# Patient Record
Sex: Male | Born: 1975 | Hispanic: Yes | Marital: Single | Smoking: Never smoker
Health system: Southern US, Community
[De-identification: ages and names within clinical notes are randomized; demographics above are authoritative.]

## PROBLEM LIST (undated history)

## (undated) DIAGNOSIS — Z8774 Personal history of (corrected) congenital malformations of heart and circulatory system: Secondary | ICD-10-CM

## (undated) DIAGNOSIS — Q211 Atrial septal defect: Secondary | ICD-10-CM

## (undated) DIAGNOSIS — E1165 Type 2 diabetes mellitus with hyperglycemia: Secondary | ICD-10-CM

## (undated) DIAGNOSIS — I071 Rheumatic tricuspid insufficiency: Secondary | ICD-10-CM

## (undated) DIAGNOSIS — Z9889 Other specified postprocedural states: Secondary | ICD-10-CM

## (undated) DIAGNOSIS — R0602 Shortness of breath: Secondary | ICD-10-CM

## (undated) DIAGNOSIS — E119 Type 2 diabetes mellitus without complications: Secondary | ICD-10-CM

## (undated) DIAGNOSIS — I519 Heart disease, unspecified: Secondary | ICD-10-CM

## (undated) DIAGNOSIS — Q263 Partial anomalous pulmonary venous connection: Secondary | ICD-10-CM

## (undated) DIAGNOSIS — I50812 Chronic right heart failure: Secondary | ICD-10-CM

## (undated) DIAGNOSIS — R0789 Other chest pain: Secondary | ICD-10-CM

## (undated) HISTORY — DX: Personal history of (corrected) congenital malformations of heart and circulatory system: Z87.74

## (undated) HISTORY — DX: Other specified postprocedural states: Z98.890

## (undated) HISTORY — DX: Chronic right heart failure: I50.812

## (undated) HISTORY — DX: Type 2 diabetes mellitus with hyperglycemia: E11.65

## (undated) HISTORY — DX: Partial anomalous pulmonary venous connection: Q26.3

## (undated) HISTORY — DX: Rheumatic tricuspid insufficiency: I07.1

## (undated) HISTORY — DX: Heart disease, unspecified: I51.9

## (undated) HISTORY — DX: Other chest pain: R07.89

## (undated) SURGERY — ECHOCARDIOGRAM, TRANSESOPHAGEAL
Anesthesia: Moderate Sedation

---

## 1997-11-22 DIAGNOSIS — Z8774 Personal history of (corrected) congenital malformations of heart and circulatory system: Secondary | ICD-10-CM

## 1997-11-22 HISTORY — PX: ASD REPAIR: SHX258

## 1997-11-22 HISTORY — DX: Personal history of (corrected) congenital malformations of heart and circulatory system: Z87.74

## 1997-12-14 DIAGNOSIS — Z9889 Other specified postprocedural states: Secondary | ICD-10-CM

## 1997-12-14 HISTORY — DX: Other specified postprocedural states: Z98.890

## 1997-12-14 HISTORY — PX: THORACOTOMY: SUR1349

## 2009-04-27 HISTORY — PX: BACK SURGERY: SHX140

## 2010-04-27 HISTORY — PX: APPENDECTOMY: SHX54

## 2010-12-08 ENCOUNTER — Other Ambulatory Visit: Payer: Self-pay

## 2010-12-08 ENCOUNTER — Emergency Department (HOSPITAL_COMMUNITY)
Admission: EM | Admit: 2010-12-08 | Discharge: 2010-12-09 | Disposition: A | Discharge status: Home / Self Care | Payer: Self-pay | Attending: Emergency Medicine | Admitting: Emergency Medicine

## 2010-12-08 ENCOUNTER — Encounter: Payer: Self-pay | Admitting: Emergency Medicine

## 2010-12-08 DIAGNOSIS — R079 Chest pain, unspecified: Secondary | ICD-10-CM | POA: Insufficient documentation

## 2010-12-08 DIAGNOSIS — E119 Type 2 diabetes mellitus without complications: Secondary | ICD-10-CM | POA: Insufficient documentation

## 2010-12-08 MED ORDER — ASPIRIN 81 MG PO CHEW
CHEWABLE_TABLET | ORAL | Status: AC
  Administered 2010-12-09: 324 mg via ORAL
  Filled 2010-12-08: qty 4

## 2010-12-08 MED ORDER — NITROGLYCERIN 0.4 MG SL SUBL
SUBLINGUAL_TABLET | SUBLINGUAL | Status: AC
Start: 2010-12-08 — End: 2010-12-09
  Administered 2010-12-09: 0.4 mg via SUBLINGUAL
  Filled 2010-12-08: qty 25

## 2010-12-08 NOTE — ED Notes (Signed)
Patient c/o chest pressure when he takes a deep breath.

## 2010-12-09 ENCOUNTER — Emergency Department (HOSPITAL_COMMUNITY): Payer: Self-pay

## 2010-12-09 LAB — COMPREHENSIVE METABOLIC PANEL
Albumin: 3.5 g/dL (ref 3.5–5.2)
BUN: 12 mg/dL (ref 6–23)
Chloride: 93 mEq/L — ABNORMAL LOW (ref 96–112)
Creatinine, Ser: 0.73 mg/dL (ref 0.50–1.35)
GFR calc non Af Amer: >60 mL/min (ref >60)
Total Bilirubin: 0.3 mg/dL (ref 0.3–1.2)

## 2010-12-09 LAB — CARDIAC PANEL(CRET KIN+CKTOT+MB+TROPI)
CK, MB: 1.6 ng/mL (ref 0.3–4.0)
Total CK: 110 U/L (ref 7–232)

## 2010-12-09 LAB — DIFFERENTIAL
Basophils Relative: 0 % (ref 0–1)
Eosinophils Absolute: 0.1 10*3/uL (ref 0.0–0.7)
Eosinophils Relative: 2 % (ref 0–5)
Lymphocytes Relative: 28 % (ref 12–46)
Lymphs Abs: 2.7 10*3/uL (ref 0.7–4.0)
Neutrophils Relative %: 64 % (ref 43–77)

## 2010-12-09 LAB — CBC
MCV: 93.7 fL (ref 78.0–100.0)
Platelets: 180 10*3/uL (ref 150–400)
RBC: 4.42 MIL/uL (ref 4.22–5.81)
RDW: 12.2 % (ref 11.5–15.5)
WBC: 9.6 10*3/uL (ref 4.0–10.5)

## 2010-12-09 MED ORDER — INSULIN ASPART 100 UNIT/ML ~~LOC~~ SOLN
20.0000 [IU] | Freq: Once | SUBCUTANEOUS | Status: AC
  Administered 2010-12-09: 20 [IU] via SUBCUTANEOUS

## 2010-12-09 MED ORDER — METFORMIN HCL 500 MG PO TABS: 500.0000 mg | ORAL_TABLET | Freq: Two times a day (BID) | ORAL | Status: DC

## 2010-12-09 MED ORDER — SODIUM CHLORIDE 0.9 % IV BOLUS (SEPSIS)
1000.0000 mL | Freq: Once | INTRAVENOUS | Status: AC
  Administered 2010-12-09: 1000 mL via INTRAVENOUS

## 2010-12-09 MED ORDER — SODIUM CHLORIDE 0.9 % IV SOLN
Freq: Once | INTRAVENOUS | Status: AC
  Administered 2010-12-09: via INTRAVENOUS

## 2010-12-09 NOTE — ED Provider Notes (Signed)
History     CSN: [id number] Arrival date & time: 12/08/2010 11:10 PM  Chief Complaint  Patient presents with  . Chest Pain   Patient is a 35 y.o. male presenting with chest pain. The history is provided by the patient.  Chest Pain The chest pain began 3 - 5 days ago (The patient states he's been having chest pain off and on for a number of days which is much worse with movement). Chest pain occurs intermittently. The chest pain is unchanged. The pain is associated with lifting. At its most intense, the pain is at 3/10. The pain is currently at 1/10. The quality of the pain is described as sharp. The pain does not radiate. Chest pain is worsened by certain positions. Pertinent negatives for primary symptoms include no fever, no fatigue, no syncope, no shortness of breath, no cough, no palpitations, no abdominal pain, no nausea and no vomiting.  Pertinent negatives for associated symptoms include no claudication. He tried nothing for the symptoms. Risk factors include male gender (diabetes).  Pertinent negatives for past medical history include no aneurysm, no anxiety/panic attacks and no seizures.  Pertinent negatives for family medical history include: no CAD in family.  Procedure history is negative for echocardiogram.     Past Medical History  Diagnosis Date  . Diabetes mellitus     No past surgical history on file.  No family history on file.  History  Substance Use Topics  . Smoking status: Never Smoker   . Smokeless tobacco: Not on file  . Alcohol Use: No      Review of Systems  Constitutional: Negative for fever and fatigue.  HENT: Negative for congestion, sinus pressure and ear discharge.   Eyes: Negative for discharge.  Respiratory: Negative for cough and shortness of breath.   Cardiovascular: Positive for chest pain. Negative for palpitations, claudication and syncope.  Gastrointestinal: Negative for nausea, vomiting, abdominal pain and diarrhea.  Genitourinary:  Negative for frequency and hematuria.  Musculoskeletal: Negative for back pain.  Skin: Negative for rash.  Neurological: Negative for seizures and headaches.  Hematological: Negative.   Psychiatric/Behavioral: Negative for hallucinations.    Physical Exam  BP 117/74  Pulse 71  Temp(Src) 99.3 F (37.4 C) (Oral)  Resp 15  Ht 5\' 5"  (1.651 m)  Wt 180 lb (81.647 kg)  BMI 29.95 kg/m2  SpO2 95%  Physical Exam  Constitutional: He is oriented to person, place, and time. He appears well-developed.  HENT:  Head: Normocephalic and atraumatic.  Eyes: Conjunctivae and EOM are normal. No scleral icterus.  Neck: Neck supple. No thyromegaly present.  Cardiovascular: Normal rate and regular rhythm.  Exam reveals no gallop and no friction rub.   No murmur heard. Pulmonary/Chest: No stridor. He has no wheezes. He has no rales. He exhibits no tenderness.  Abdominal: He exhibits no distension. There is no tenderness. There is no rebound.  Musculoskeletal: Normal range of motion. He exhibits no edema.  Lymphadenopathy:    He has no cervical adenopathy.  Neurological: He is oriented to person, place, and time. Coordination normal.  Skin: No rash noted. No erythema.  Psychiatric: He has a normal mood and affect. His behavior is normal.    ED Course  Procedures.  Results discussed  MDM Non cardiac chest pain Results for orders placed during the hospital encounter of 12/08/10  CBC      Component Value Range   WBC 9.6  4.0 - 10.5 (K/uL)   RBC 4.42  4.22 -  5.81 (MIL/uL)   Hemoglobin 14.5  13.0 - 17.0 (g/dL)   HCT 16.1  09.6 - 04.5 (%)   MCV 93.7  78.0 - 100.0 (fL)   MCH 32.8  26.0 - 34.0 (pg)   MCHC 35.0  30.0 - 36.0 (g/dL)   RDW 40.9  81.1 - 91.4 (%)   Platelets 180  150 - 400 (K/uL)  DIFFERENTIAL      Component Value Range   Neutrophils Relative 64  43 - 77 (%)   Neutro Abs 6.1  1.7 - 7.7 (K/uL)   Lymphocytes Relative 28  12 - 46 (%)   Lymphs Abs 2.7  0.7 - 4.0 (K/uL)   Monocytes  Relative 7  3 - 12 (%)   Monocytes Absolute 0.7  0.1 - 1.0 (K/uL)   Eosinophils Relative 2  0 - 5 (%)   Eosinophils Absolute 0.1  0.0 - 0.7 (K/uL)   Basophils Relative 0  0 - 1 (%)   Basophils Absolute 0.0  0.0 - 0.1 (K/uL)  COMPREHENSIVE METABOLIC PANEL      Component Value Range   Sodium 130 (*) 135 - 145 (mEq/L)   Potassium 3.8  3.5 - 5.1 (mEq/L)   Chloride 93 (*) 96 - 112 (mEq/L)   CO2 19  19 - 32 (mEq/L)   Glucose, Bld 500 (*) 70 - 99 (mg/dL)   BUN 12  6 - 23 (mg/dL)   Creatinine, Ser 7.82  0.50 - 1.35 (mg/dL)   Calcium 9.4  8.4 - 95.6 (mg/dL)   Total Protein 7.8  6.0 - 8.3 (g/dL)   Albumin 3.5  3.5 - 5.2 (g/dL)   AST 35  0 - 37 (U/L)   ALT 54 (*) 0 - 53 (U/L)   Alkaline Phosphatase 151 (*) 39 - 117 (U/L)   Total Bilirubin 0.3  0.3 - 1.2 (mg/dL)   GFR calc non Af Amer >60  >60 (mL/min)   GFR calc Af Amer >60  >60 (mL/min)  CARDIAC PANEL(CRET KIN+CKTOT+MB+TROPI)      Component Value Range   Total CK 110  7 - 232 (U/L)   CK, MB 1.6  0.3 - 4.0 (ng/mL)   Troponin I <0.30  <0.30 (ng/mL)   Relative Index 1.5  0.0 - 2.5   D-DIMER, QUANTITATIVE      Component Value Range   D-Dimer, Quant 0.35  0.00 - 0.48 (ug/mL-FEU)  GLUCOSE, CAPILLARY      Component Value Range   Glucose-Capillary 273 (*) 70 - 99 (mg/dL)   Dg Chest 2 View  06/10/863  *RADIOLOGY REPORT*  Clinical Data: Cough.  Shortness of breath.  Chest pain.  CHEST - 2 VIEW  Comparison: None.  Findings: Low lung volumes are present, causing crowding of the pulmonary vasculature.  Patchy airspace opacities in the perihilar regions and lingula noted, with evidence of prior median sternotomy.  No pleural effusion identified.  IMPRESSION:  1.  Perihilar, lingular, and right middle lobe opacities favor atelectasis or scarring given the low lung volumes, although atypical pneumonia cannot be excluded.  Original Report Authenticated By: Dellia Cloud, M.D.     Date: 12/09/2010  Rate: 91  Rhythm: normal sinus rhythm  QRS  Axis: normal  Intervals: normal  ST/T Wave abnormalities: nonspecific ST changes  Conduction Disutrbances:none  Narrative Interpretation:   Old EKG Reviewed: none available Some inverted t waves anteriorly     Benny Lennert, MD 12/09/10 0451

## 2010-12-09 NOTE — ED Notes (Addendum)
Dr. Estell Harpin notified of glucose level

## 2010-12-09 NOTE — ED Notes (Signed)
Pt states that chest pain did not improve with nitro sl

## 2013-03-27 ENCOUNTER — Encounter (HOSPITAL_COMMUNITY): Payer: Self-pay | Admitting: Emergency Medicine

## 2013-03-27 ENCOUNTER — Emergency Department (HOSPITAL_COMMUNITY): Payer: Self-pay

## 2013-03-27 ENCOUNTER — Observation Stay (HOSPITAL_COMMUNITY)
Admission: EM | Admit: 2013-03-27 | Discharge: 2013-03-29 | Disposition: A | Discharge status: Home Health | Payer: Self-pay | Attending: Family Medicine | Admitting: Family Medicine

## 2013-03-27 DIAGNOSIS — E1165 Type 2 diabetes mellitus with hyperglycemia: Secondary | ICD-10-CM

## 2013-03-27 DIAGNOSIS — R0789 Other chest pain: Principal | ICD-10-CM | POA: Insufficient documentation

## 2013-03-27 DIAGNOSIS — E119 Type 2 diabetes mellitus without complications: Secondary | ICD-10-CM | POA: Insufficient documentation

## 2013-03-27 DIAGNOSIS — Z9889 Other specified postprocedural states: Secondary | ICD-10-CM

## 2013-03-27 DIAGNOSIS — R079 Chest pain, unspecified: Secondary | ICD-10-CM

## 2013-03-27 DIAGNOSIS — R739 Hyperglycemia, unspecified: Secondary | ICD-10-CM

## 2013-03-27 DIAGNOSIS — R0602 Shortness of breath: Secondary | ICD-10-CM | POA: Insufficient documentation

## 2013-03-27 HISTORY — DX: Type 2 diabetes mellitus without complications: E11.9

## 2013-03-27 HISTORY — DX: Other chest pain: R07.89

## 2013-03-27 LAB — CBC WITH DIFFERENTIAL/PLATELET
Basophils Relative: 0 % (ref 0–1)
Eosinophils Absolute: 0.2 10*3/uL (ref 0.0–0.7)
HCT: 48.2 % (ref 39.0–52.0)
Hemoglobin: 16.9 g/dL (ref 13.0–17.0)
MCH: 33.1 pg (ref 26.0–34.0)
MCHC: 35.1 g/dL (ref 30.0–36.0)
MCV: 94.5 fL (ref 78.0–100.0)
Monocytes Absolute: 0.6 10*3/uL (ref 0.1–1.0)
Monocytes Relative: 7 % (ref 3–12)

## 2013-03-27 LAB — COMPREHENSIVE METABOLIC PANEL
Albumin: 4.1 g/dL (ref 3.5–5.2)
BUN: 18 mg/dL (ref 6–23)
Creatinine, Ser: 0.77 mg/dL (ref 0.50–1.35)
GFR calc Af Amer: >90 mL/min (ref >90)
Total Bilirubin: 0.3 mg/dL (ref 0.3–1.2)
Total Protein: 7.7 g/dL (ref 6.0–8.3)

## 2013-03-27 LAB — TROPONIN I: Troponin I: <0.3 ng/mL (ref <0.30)

## 2013-03-27 LAB — D-DIMER, QUANTITATIVE: D-Dimer, Quant: <0.27 ug/mL-FEU (ref 0.00–0.48)

## 2013-03-27 MED ORDER — ONDANSETRON HCL 4 MG/2ML IJ SOLN: 4.0000 mg | Freq: Three times a day (TID) | INTRAMUSCULAR | Status: DC | PRN

## 2013-03-27 NOTE — H&P (Signed)
[facility] History and Physical  Elster Corbello  ZOX:[id number]  DOB: 1975/07/14   DOA: 03/27/2013   PCP:   Delbert Harness  Chief Complaint:  Chest pain since 2:00 today  HPI: [name] is a 37 y.o. male.  Overweight Hispanic gentleman with a history of diabetes comes in complaining of chest pain radiating down his left arm and right-sided flank pain since today. His English is not fluent but he is able to indicate that he sometimes has chest pain that comes and goes but not as severe as today. The pain seems to be aggravated a little bit by breathing and also by bending forward. It is associated with nausea. No dizziness or diaphoresis. He has not tried to take anything to relieve the pain  He was seen 3 different oral medications for his diabetes including metformin but he doesn't know the names of others. He has never seen a cardiologist.  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  Rewiew of Systems:   All systems negative except as marked bold or noted in the HPI;  Constitutional:    malaise, fever and chills. ;  Eyes:   eye pain, redness and discharge. ;  ENMT:   ear pain, hoarseness, nasal congestion, sinus pressure and sore throat. ;  Cardiovascular:    , dyspnea and peripheral edema.  Respiratory:   cough, hemoptysis, wheezing and stridor. ;  Gastrointestinal:  nausea, vomiting, diarrhea, constipation,  melena, blood in stool, hematemesis, jaundice and rectal bleeding. unusual weight loss..   Genitourinary:    frequency, dysuria, incontinence,flank pain and hematuria; Musculoskeletal:   back pain and neck pain.  swelling and trauma.;  Skin: .  pruritus, rash, abrasions, bruising and skin lesion.; ulcerations Neuro:    headache, lightheadedness and neck stiffness.  weakness, altered level of consciousness, altered mental status, extremity weakness, burning feet,  involuntary movement, seizure and syncope.  Psych:    anxiety, depression, insomnia, tearfulness, panic attacks, hallucinations, paranoia, suicidal or homicidal ideation    Past Medical History  Diagnosis Date  . Diabetes mellitus     History reviewed. No pertinent past surgical history.  Medications:  HOME MEDS: Prior to Admission medications   Not on File     Allergies:  No Known Allergies  Social History:   reports that he has never smoked. He does not have any smokeless tobacco history on file. He reports that he does not drink alcohol or use illicit drugs.  Family History: History reviewed. No pertinent family history.   Physical Exam: Filed Vitals:   03/27/13 1955  BP: 128/78  Pulse: 81  Temp: 98.1 F (36.7 C)  TempSrc: Oral  Resp: 18  Height: 5\' 5"  (1.651 m)  Weight: 81.647 kg (180 lb)  SpO2: 95%   Blood pressure 128/78, pulse 81, temperature 98.1 F (36.7 C), temperature source Oral, resp. rate 18, height 5\' 5"  (1.651 m), weight 81.647 kg (180 lb), SpO2 95.00%. Body mass index is 29.95 kg/(m^2).   GEN:  Pleasant Hispanic gentleman lying bed in no acute distress; cooperative with exam PSYCH:  alert and oriented x4; neither anxious nor depressed; affect is appropriate. HEENT: Mucous membranes pink and anicteric; PERRLA; EOM intact; no cervical lymphadenopathy nor thyromegaly or carotid bruit; no JVD; Breasts:: Not examined CHEST WALL: No tenderness; midline sternotomy scar  CHEST: Normal respiration, clear to auscultation bilaterally HEART: Regular rate;  S4 and S3 gallop and 2/ 6 systolic murmur  BACK: No kyphosis no scoliosis; no CVA tenderness ABDOMEN: Obese, soft non-tender; no masses, no organomegaly, normal abdominal bowel sounds; no pannus; no intertriginous candida. Rectal Exam: Not done EXTREMITIES: No bone or joint deformity; ; no edema; no ulcerations. Genitalia: not examined PULSES: 2+ and symmetric SKIN: Normal hydration no rash or  ulceration CNS: Cranial nerves 2-12 grossly intact no focal lateralizing neurologic deficit   Labs on Admission:  Basic Metabolic Panel:  Recent Labs Lab 03/27/13 2154  NA 131*  K 4.2  CL 94*  CO2 24  GLUCOSE 361*  BUN 18  CREATININE 0.77  CALCIUM 9.4   Liver Function Tests:  Recent Labs Lab 03/27/13 2154  AST 28  ALT 34  ALKPHOS 121*  BILITOT 0.3  PROT 7.7  ALBUMIN 4.1   No results found for this basename: LIPASE, AMYLASE,  in the last 168 hours No results found for this basename: AMMONIA,  in the last 168 hours CBC:  Recent Labs Lab 03/27/13 2154  WBC 8.8  NEUTROABS 4.9  HGB 16.9  HCT 48.2  MCV 94.5  PLT 176   Cardiac Enzymes:  Recent Labs Lab 03/27/13 2154  TROPONINI <0.30   BNP: No components found with this basename: POCBNP,  D-dimer: No components found with this basename: D-DIMER,  CBG: No results found for this basename: GLUCAP,  in the last 168 hours  Radiological Exams on Admission: Dg Chest 2 View  03/27/2013   CLINICAL DATA:  Chest pain this evening with history of diabetes  EXAM: CHEST  2 VIEW  COMPARISON:  December 09, 2010  FINDINGS: Mild cardiac enlargement. Status post CABG. Right upper lobe interstitial change consistent with scarring. There also coarse bilateral lower lung zone markings which are stable from prior study.  IMPRESSION: Bilateral parenchymal scarring, with no acute findings.   Electronically Signed   By: Esperanza Heir M.D.   On: 03/27/2013 22:42    EKG: Independently reviewed.  sinus rhythm, left atrial enlargement; right bundle branch block, left ventricular hypertrophy  Assessment/Plan   Active Problems:   Chest pain   Hyperglycemia   Diabetes type 2, uncontrolled   PLAN: Admit patient on a chest pain rule out protocol When necessary nitroglycerin for chest pain Cardiology consult A sliding scale insulin for the time being His primary care physician will take over management in the morning and is aware  of his heart patient diabetic regimen   Other plans as per orders.  Code Status: Full code   Shalece Staffa Nocturnist [facility] Pager [phone]   03/27/2013, 11:43 PM

## 2013-03-27 NOTE — ED Provider Notes (Signed)
CSN: [id number]     Arrival date & time 03/27/13  1948 History  This chart was scribed for Glynn Octave, MD by Bennett Scrape, ED Scribe. This patient was seen in room APA08/APA08 and the patient's care was started at 9:49 PM.   Chief Complaint  Patient presents with  . Flank Pain  . Chest Pain    The history is provided by the patient. A language interpreter was used Hospital doctor ).    HPI Comments: Brittney Caraway is a 37 y.o. male who presents to the Emergency Department complaining of left CP described as pressure that has been constant and radiating into the left arm since it's onset at 2 PM today. He lists SOB and one brief episode of dizziness as associated symptoms. He reports that the pain is mildly aggravated with deep breathing and exertion. He denies having any alleviating modifying factors. He reports prior episodes of the same that resolved after a few hours. He denies any recent cough or fevers, abdominal pain, nausea or emesis. He denies having a h/o cardiac conditions, prior MIs or prior stent placements. He has a h/o DM but denies any known HTN diagnoses.   Past Medical History  Diagnosis Date  . Diabetes mellitus    History reviewed. No pertinent past surgical history. History reviewed. No pertinent family history. History  Substance Use Topics  . Smoking status: Never Smoker   . Smokeless tobacco: Not on file  . Alcohol Use: No    Review of Systems  A complete 10 system review of systems was obtained and all systems are negative except as noted in the HPI and PMH.   Allergies  Review of patient's allergies indicates no known allergies.  Home Medications   No current outpatient prescriptions on file. Triage Vitals: BP 128/78  Pulse 81  Temp(Src) 98.1 F (36.7 C) (Oral)  Resp 18  Ht 5\' 5"  (1.651 m)  Wt 180 lb (81.647 kg)  BMI 29.95 kg/m2  SpO2 95%  Physical Exam  Nursing note and vitals reviewed. Constitutional: He is oriented to  person, place, and time. He appears well-developed and well-nourished. No distress.  HENT:  Head: Normocephalic and atraumatic.  Eyes: Conjunctivae and EOM are normal.  Neck: Normal range of motion. Neck supple. No tracheal deviation present.  Cardiovascular: Normal rate, regular rhythm and normal heart sounds.   No murmur heard. Pulmonary/Chest: Effort normal and breath sounds normal. No respiratory distress. He has no wheezes. He has no rales.  Abdominal: Soft. Bowel sounds are normal. There is no tenderness.  Musculoskeletal: Normal range of motion. He exhibits no edema.  Bilateral lumbar paraspinal tenderness. No midline tenderness   Neurological: He is alert and oriented to person, place, and time. No cranial nerve deficit.  5/5 strength in bilateral lower extremities. Ankle plantar and dorsiflexion intact. Great toe extension intact bilaterally.   Skin: Skin is warm and dry.  Psychiatric: He has a normal mood and affect. His behavior is normal.    ED Course  Procedures (including critical care time)  DIAGNOSTIC STUDIES: Oxygen Saturation is 95% on RA, adequate by my interpretation.    COORDINATION OF CARE: 9:52 PM-Discussed treatment plan which includes CXR, CBC panel, CMP and troponin with pt at bedside and pt agreed to plan.   Labs Review Labs Reviewed  COMPREHENSIVE METABOLIC PANEL - Abnormal; Notable for the following:    Sodium 131 (*)    Chloride 94 (*)    Glucose, Bld 361 (*)    Alkaline  Phosphatase 121 (*)    All other components within normal limits  CBC WITH DIFFERENTIAL  TROPONIN I  D-DIMER, QUANTITATIVE  PRO B NATRIURETIC PEPTIDE   Imaging Review Dg Chest 2 View  03/27/2013   CLINICAL DATA:  Chest pain this evening with history of diabetes  EXAM: CHEST  2 VIEW  COMPARISON:  December 09, 2010  FINDINGS: Mild cardiac enlargement. Status post CABG. Right upper lobe interstitial change consistent with scarring. There also coarse bilateral lower lung zone markings  which are stable from prior study.  IMPRESSION: Bilateral parenchymal scarring, with no acute findings.   Electronically Signed   By: Esperanza Heir M.D.   On: 03/27/2013 22:42    EKG Interpretation    Date/Time:  Monday March 27 2013 19:57:55 EST Ventricular Rate:  88 PR Interval:  150 QRS Duration: 138 QT Interval:  388 QTC Calculation: 469 R Axis:   4 Text Interpretation:  Normal sinus rhythm Possible Left atrial enlargement Right bundle branch block Left ventricular hypertrophy Abnormal ECG When compared with ECG of 08-Dec-2010 23:28, QT has shortened No significant change was found Confirmed by Manus Gunning  MD, Jeri Rawlins ([id number]) on 03/27/2013 10:27:38 PM            MDM   1. Chest pain   2. Hyperglycemia    Patient presents with intermittent chest pain and pressure for the past two days.  Pain radiates to L arm and is associated with nausea.  EKG unchanged. RBBB and LVH.  Troponin negative.  CXR negative.  D-dimer negative.  Heart score is 3. Patient is a diabetic with no previous cardiac workup. His pain description is typical and atypical features. He would benefit from serial enzymes and possible stress test in the morning. He is not have a primary doctor.  I personally performed the services described in this documentation, which was scribed in my presence. The recorded information has been reviewed and is accurate.    Glynn Octave, MD 03/28/13 [id number]

## 2013-03-27 NOTE — ED Notes (Signed)
Pt c/o left chest pressure and bilateral flank pain x 2 days.

## 2013-03-27 NOTE — ED Notes (Signed)
Pt reporting bilateral flank pain and CP x2 days.  No distress noted.  Reported one episode of nausea on Saturday.

## 2013-03-28 ENCOUNTER — Encounter (HOSPITAL_COMMUNITY): Payer: Self-pay | Admitting: *Deleted

## 2013-03-28 DIAGNOSIS — E1165 Type 2 diabetes mellitus with hyperglycemia: Secondary | ICD-10-CM | POA: Diagnosis present

## 2013-03-28 DIAGNOSIS — R0789 Other chest pain: Secondary | ICD-10-CM

## 2013-03-28 DIAGNOSIS — Z9889 Other specified postprocedural states: Secondary | ICD-10-CM

## 2013-03-28 DIAGNOSIS — I369 Nonrheumatic tricuspid valve disorder, unspecified: Secondary | ICD-10-CM

## 2013-03-28 DIAGNOSIS — IMO0002 Reserved for concepts with insufficient information to code with codable children: Secondary | ICD-10-CM

## 2013-03-28 DIAGNOSIS — R739 Hyperglycemia, unspecified: Secondary | ICD-10-CM | POA: Diagnosis present

## 2013-03-28 HISTORY — DX: Reserved for concepts with insufficient information to code with codable children: IMO0002

## 2013-03-28 HISTORY — DX: Type 2 diabetes mellitus with hyperglycemia: E11.65

## 2013-03-28 LAB — GLUCOSE, CAPILLARY
Glucose-Capillary: 187 mg/dL — ABNORMAL HIGH (ref 70–99)
Glucose-Capillary: 273 mg/dL — ABNORMAL HIGH (ref 70–99)
Glucose-Capillary: 212 mg/dL — ABNORMAL HIGH (ref 70–99)

## 2013-03-28 LAB — BASIC METABOLIC PANEL
BUN: 17 mg/dL (ref 6–23)
Calcium: 8.7 mg/dL (ref 8.4–10.5)
Chloride: 101 mEq/L (ref 96–112)
GFR calc non Af Amer: >90 mL/min (ref >90)
Glucose, Bld: 253 mg/dL — ABNORMAL HIGH (ref 70–99)
Potassium: 3.6 mEq/L (ref 3.5–5.1)
Sodium: 135 mEq/L (ref 135–145)

## 2013-03-28 LAB — MAGNESIUM: Magnesium: 2 mg/dL (ref 1.5–2.5)

## 2013-03-28 LAB — URINALYSIS, ROUTINE W REFLEX MICROSCOPIC
Bilirubin Urine: NEGATIVE
Glucose, UA: 500 mg/dL — AB
Hgb urine dipstick: NEGATIVE
Ketones, ur: NEGATIVE mg/dL
Leukocytes, UA: NEGATIVE
Protein, ur: NEGATIVE mg/dL
Specific Gravity, Urine: >1.03 — ABNORMAL HIGH (ref 1.005–1.030)
Urobilinogen, UA: 1 mg/dL (ref 0.0–1.0)
pH: 6 (ref 5.0–8.0)

## 2013-03-28 LAB — LIPID PANEL
LDL Cholesterol: 93 mg/dL (ref 0–99)
Total CHOL/HDL Ratio: 4.7 RATIO
VLDL: 28 mg/dL (ref 0–40)

## 2013-03-28 LAB — CBC
Hemoglobin: 16 g/dL (ref 13.0–17.0)
MCHC: 35 g/dL (ref 30.0–36.0)
Platelets: 165 10*3/uL (ref 150–400)
RBC: 4.84 MIL/uL (ref 4.22–5.81)
WBC: 7.3 10*3/uL (ref 4.0–10.5)

## 2013-03-28 LAB — HEMOGLOBIN A1C: Mean Plasma Glucose: 229 mg/dL — ABNORMAL HIGH (ref <117)

## 2013-03-28 LAB — TROPONIN I: Troponin I: <0.3 ng/mL (ref <0.30)

## 2013-03-28 LAB — TSH: TSH: 2.323 u[IU]/mL (ref 0.350–4.500)

## 2013-03-28 LAB — LIPASE, BLOOD: Lipase: 38 U/L (ref 11–59)

## 2013-03-28 MED ORDER — ASPIRIN 81 MG PO CHEW
324.0000 mg | CHEWABLE_TABLET | Freq: Once | ORAL | Status: AC
  Administered 2013-03-28: 324 mg via ORAL
  Filled 2013-03-28: qty 4

## 2013-03-28 MED ORDER — ONDANSETRON HCL 4 MG PO TABS: 4.0000 mg | ORAL_TABLET | Freq: Four times a day (QID) | ORAL | Status: DC | PRN

## 2013-03-28 MED ORDER — DOCUSATE SODIUM 100 MG PO CAPS
100.0000 mg | ORAL_CAPSULE | Freq: Two times a day (BID) | ORAL | Status: DC
  Administered 2013-03-28 – 2013-03-29 (×4): 100 mg via ORAL
  Filled 2013-03-28 (×4): qty 1

## 2013-03-28 MED ORDER — ONDANSETRON HCL 4 MG/2ML IJ SOLN: 4.0000 mg | INTRAMUSCULAR | Status: DC | PRN

## 2013-03-28 MED ORDER — NITROGLYCERIN 0.4 MG SL SUBL: 0.4000 mg | SUBLINGUAL_TABLET | SUBLINGUAL | Status: DC | PRN

## 2013-03-28 MED ORDER — PANTOPRAZOLE SODIUM 40 MG IV SOLR
40.0000 mg | Freq: Once | INTRAVENOUS | Status: AC
  Administered 2013-03-28: 40 mg via INTRAVENOUS
  Filled 2013-03-28: qty 40

## 2013-03-28 MED ORDER — PANTOPRAZOLE SODIUM 40 MG PO TBEC
40.0000 mg | DELAYED_RELEASE_TABLET | Freq: Two times a day (BID) | ORAL | Status: DC
  Administered 2013-03-28 – 2013-03-29 (×2): 40 mg via ORAL
  Filled 2013-03-28 (×2): qty 1

## 2013-03-28 MED ORDER — SODIUM CHLORIDE 0.9 % IJ SOLN
3.0000 mL | Freq: Two times a day (BID) | INTRAMUSCULAR | Status: DC
  Administered 2013-03-28 (×3): 3 mL via INTRAVENOUS

## 2013-03-28 MED ORDER — TRAZODONE HCL 50 MG PO TABS: 25.0000 mg | ORAL_TABLET | Freq: Every evening | ORAL | Status: DC | PRN

## 2013-03-28 MED ORDER — INSULIN ASPART 100 UNIT/ML ~~LOC~~ SOLN: 0.0000 [IU] | Freq: Every day | SUBCUTANEOUS | Status: DC

## 2013-03-28 MED ORDER — ASPIRIN EC 81 MG PO TBEC
81.0000 mg | DELAYED_RELEASE_TABLET | Freq: Every day | ORAL | Status: DC
  Administered 2013-03-28 – 2013-03-29 (×2): 81 mg via ORAL
  Filled 2013-03-28 (×2): qty 1

## 2013-03-28 MED ORDER — INSULIN ASPART 100 UNIT/ML ~~LOC~~ SOLN
0.0000 [IU] | Freq: Three times a day (TID) | SUBCUTANEOUS | Status: DC
  Administered 2013-03-28: 3 [IU] via SUBCUTANEOUS
  Administered 2013-03-28: 8 [IU] via SUBCUTANEOUS
  Administered 2013-03-28: 5 [IU] via SUBCUTANEOUS
  Administered 2013-03-29: 3 [IU] via SUBCUTANEOUS

## 2013-03-28 MED ORDER — POTASSIUM CHLORIDE IN NACL 20-0.9 MEQ/L-% IV SOLN
INTRAVENOUS | Status: DC
  Administered 2013-03-28: 05:00:00 via INTRAVENOUS

## 2013-03-28 MED ORDER — ACETAMINOPHEN 325 MG PO TABS: 650.0000 mg | ORAL_TABLET | ORAL | Status: DC | PRN

## 2013-03-28 MED ORDER — ENOXAPARIN SODIUM 40 MG/0.4ML ~~LOC~~ SOLN
40.0000 mg | SUBCUTANEOUS | Status: DC
  Administered 2013-03-28: 40 mg via SUBCUTANEOUS
  Filled 2013-03-28: qty 0.4

## 2013-03-28 MED ORDER — PANTOPRAZOLE SODIUM 40 MG IV SOLR
40.0000 mg | Freq: Two times a day (BID) | INTRAVENOUS | Status: DC
  Administered 2013-03-28: 40 mg via INTRAVENOUS
  Filled 2013-03-28: qty 40

## 2013-03-28 MED ORDER — MORPHINE SULFATE 2 MG/ML IJ SOLN: 2.0000 mg | INTRAMUSCULAR | Status: DC | PRN

## 2013-03-28 NOTE — Plan of Care (Signed)
Problem: Phase I Progression Outcomes Goal: OOB as tolerated unless otherwise ordered Patient ambulating in hallway without difficulty.No c/o pain or discomfort noted.

## 2013-03-28 NOTE — Progress Notes (Signed)
[id number] 

## 2013-03-28 NOTE — Progress Notes (Signed)
UR completed 

## 2013-03-28 NOTE — Progress Notes (Signed)
The patient is receiving Protonix by the intravenous route.  Based on criteria approved by the Pharmacy and Therapeutics Committee and the Medical Executive Committee, the medication is being converted to the equivalent oral dose form.  These criteria include: -No Active GI bleeding -Able to tolerate diet of full liquids (or better) or tube feeding OR able to tolerate other medications by the oral or enteral route  If you have any questions about this conversion, please contact the Pharmacy Department (ext [phone]).  Thank you.  Wayland Denis, [facility] 03/28/2013 2:28 PM
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
## 2013-03-28 NOTE — Progress Notes (Signed)
*  PRELIMINARY RESULTS* Echocardiogram 2D Echocardiogram has been performed.  Faustine Tates 03/28/2013, 4:08 PM

## 2013-03-28 NOTE — ED Notes (Signed)
Pt resting quietly.  No distress noted.  NSR on monitor.

## 2013-03-29 DIAGNOSIS — R079 Chest pain, unspecified: Secondary | ICD-10-CM

## 2013-03-29 MED ORDER — METFORMIN HCL 500 MG PO TABS: 500.0000 mg | ORAL_TABLET | Freq: Two times a day (BID) | ORAL | Status: DC

## 2013-03-29 MED ORDER — METFORMIN HCL 500 MG PO TABS
500.0000 mg | ORAL_TABLET | Freq: Two times a day (BID) | ORAL | Status: DC
  Administered 2013-03-29: 500 mg via ORAL
  Filled 2013-03-29: qty 1

## 2013-03-29 MED ORDER — PAROXETINE HCL 20 MG PO TABS
20.0000 mg | ORAL_TABLET | Freq: Every day | ORAL | Status: DC
  Administered 2013-03-29: 20 mg via ORAL
  Filled 2013-03-29: qty 1

## 2013-03-29 MED ORDER — NITROGLYCERIN 0.4 MG SL SUBL: 0.4000 mg | SUBLINGUAL_TABLET | SUBLINGUAL | Status: DC | PRN

## 2013-03-29 MED ORDER — ASPIRIN 81 MG PO TBEC: 81.0000 mg | DELAYED_RELEASE_TABLET | Freq: Every day | ORAL | Status: AC

## 2013-03-29 MED ORDER — PAROXETINE HCL 20 MG PO TABS: 20.0000 mg | ORAL_TABLET | Freq: Every day | ORAL | Status: DC

## 2013-03-29 MED ORDER — LIVING WELL WITH DIABETES BOOK - IN SPANISH
Freq: Once | Status: AC
  Administered 2013-03-29: 13:00:00
  Filled 2013-03-29: qty 1

## 2013-03-29 NOTE — Progress Notes (Signed)
Patient being d/c home with prescriptions. IV cath removed and intact. No pain/swelling at site. Patient verbalizes understanding. Awaiting for transport downstairs.

## 2013-03-29 NOTE — Progress Notes (Signed)
Inpatient Diabetes Program Recommendations  AACE/ADA: New Consensus Statement on Inpatient Glycemic Control (2013)  Target Ranges:  Prepandial:   less than 140 mg/dL      Peak postprandial:   less than 180 mg/dL (1-2 hours)      Critically ill patients:  140 - 180 mg/dL   Results for [name], [name] (MRN [id number]) as of 03/29/2013 09:29  Ref. Range 03/28/2013 03:37  Hemoglobin A1C Latest Range: <5.7 % 9.6 (H)   Results for [name], [name] (MRN [id number]) as of 03/29/2013 09:29  Ref. Range 03/28/2013 07:45 03/28/2013 11:35 03/28/2013 16:20 03/28/2013 21:36 03/29/2013 07:48  Glucose-Capillary Latest Range: 70-99 mg/dL 782 (H) 956 (H) 213 (H) 154 (H) 177 (H)   Inpatient Diabetes Program Recommendations Insulin - Basal: Please consider ordering low dose basal insulin; recommend starting with Levemir 10 units daily.  Note patient has no insurance, may want to use NPH instead of Levemir in which case would recommend starting with NPH 5 units BID.  Note: Blood glucose ranged from 154-273 mg/dl on 08/6 and patient received a total of Novolog 16 units for correction on 12/2.  Fasting glucose this morning was 177 mg/dl.  Please consider ordering low dose basal insulin; recommend starting with Levemir 10 units daily. If basal insulin will be prescribed as an outpatient, may want to order NPH versus Levemir as an inpatient to determine glycemic effect prior to discharge.  If NPH is ordered, recommend starting with NPH 5 units BID.    MD - Please advise NURSING if patient will be discharged on insulin to ensure patient is properly educated prior to discharge.   Thanks, Orlando Penner, RN, MSN, CCRN Diabetes Coordinator Inpatient Diabetes Program [phone] (Team Pager) [phone] ([facility] office) [phone] [facility] office)

## 2013-03-29 NOTE — Discharge Summary (Signed)
[id number] 

## 2013-03-29 NOTE — Progress Notes (Addendum)
Subjective:  Breathing fine. No further chest pain.  Objective:  Vital Signs in the last 24 hours: Temp:  [97.4 F (36.3 C)-98 F (36.7 C)] 97.4 F (36.3 C) (12/03 0500) Pulse Rate:  [58-67] 58 (12/03 0500) Resp:  [15-18] 15 (12/03 0500) BP: (109-131)/(73-82) 109/73 mmHg (12/03 0500) SpO2:  [91 %-96 %] 94 % (12/03 0500) Weight:  [174 lb (78.926 kg)] 174 lb (78.926 kg) (12/03 0500)  Intake/Output from previous day: 12/02 0701 - 12/03 0700 In: 2061.7 [P.O.:720; I.V.:1341.7] Out: 500 [Urine:500] Intake/Output from this shift:    Physical Exam: NECK: Without JVD, HJR, or bruit LUNGS: Clear anterior, posterior, lateral HEART: Regular rate and rhythm, S4, 2-3/6 systolic murmur LSB, no rub, bruit, thrill, or heave EXTREMITIES: Without cyanosis, clubbing, or edema   Lab Results:  Recent Labs  03/27/13 2154 03/28/13 0337  WBC 8.8 7.3  HGB 16.9 16.0  PLT 176 165    Recent Labs  03/27/13 2154 03/28/13 0337  NA 131* 135  K 4.2 3.6  CL 94* 101  CO2 24 24  GLUCOSE 361* 253*  BUN 18 17  CREATININE 0.77 0.75    Recent Labs  03/28/13 0919 03/28/13 1529  TROPONINI <0.30 <0.30   Hepatic Function Panel  Recent Labs  03/27/13 2154  PROT 7.7  ALBUMIN 4.1  AST 28  ALT 34  ALKPHOS 121*  BILITOT 0.3    Recent Labs  03/28/13 0919  CHOL 154   No results found for this basename: PROTIME,  in the last 72 hours  Imaging: Study Conclusions  - Left ventricle: The cavity size was normal. Wall thickness   was increased in a pattern of mild LVH. Systolic function   was normal. The estimated ejection fraction was in the   range of 60% to 65%. Wall motion was normal; there were no   regional wall motion abnormalities. No evidence of VSD.   Features are consistent with a pseudonormal left   ventricular filling pattern, with concomitant abnormal   relaxation and increased filling pressure (grade 2   diastolic dysfunction). - Ventricular septum: The contour showed  diastolic   flattening and systolic flattening consistent with   increased RV pressure and volume. - Mitral valve: Trivial regurgitation. - Left atrium: The atrium was mildly dilated. - Right ventricle: The cavity size was severely dilated. - Right atrium: The atrium was severely dilated. - Atrial septum: No definite evidence of repair. Suggestive   of secundum ASD or PFO. The septum bowed from left to   right, consistent with increased left atrial pressure.   There was a left-to-right shunt. - Tricuspid valve: Dilated annulus with mild prolapse of the   anterior leaflet. Moderate regurgitation. - Pulmonary arteries: PA peak pressure: 42mm Hg (S). - Pericardium, extracardiac: There was no pericardial   effusion. Impressions:  - No prior study available for comparison. Mild LVH with   LVEF 60-65%, grade 2 diastolic dysfunction. Mild left   atrial enlargement. History indicates prior repair of   "hole in heart." There is evidence of RV pressure and   volume overload with severe RV enlargement as well.   Possible secundum ASD or PFO without definitive evidence   of repair. There is a left to right shunt by color   Doppler. Coronary sinus appears dilated. No VSD   visualized. Severe right atrial enlargement. Moderate   tricuspid regurgitation with PASP 42 mmHg. Suggest   obtaining prior operative report to further clarify.    Cardiac Studies:  Assessment/Plan:  1. 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 Cardiac markers negative  2. Possible history of ASD or VSD repair at [facility] 15 years ago, records requested. See 2Decho above. RV pressure and volume overload with severe RV enlargement, possible ASD or PFO without definite evidence of repair.Could add low dose diuretic. Dr. Wyline Mood to review and await records. F/u in our office.  3. Type 2 diabetes mellitus, no regular medical followup, although reportedly on  oral hypoglycemics at home.            LOS: 2 days    Jacolyn Reedy PA-C 03/29/2013, 10:09 AM   Attending Note Patient seen and discussed with NP Lenze. 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 Denies any symptoms today. Do not recommend any further ischemic workup at this time, will reevaluate at follow up. Regarding history of congenital heart disease, we have requested records and are awaiting the reports. His echo shows normal LV function but significant RV and RA enlargement, tricuspid anular dilatation with at least moderate TR, and moderate pulmonary HTN with PASP in mid 40s. Images are limited of the interatrial septum but there appears to be a possible atrial septal defect, most likely a PFO or secundum ASD. It is unclear if this is the hole he referred to previously, and if any form of repair was done. He denies any significant DOE, though notes some mild SOB with very heavy levels of exertion. This warrants further evaluation, first step would be a TEE to better define the septum and shunt. Will make NPO tonight and plan for test tomorrow in endoscopy.   Dina Rich MD

## 2013-03-29 NOTE — Care Management Note (Signed)
    Page 1 of 1   03/29/2013     2:59:14 PM   CARE MANAGEMENT NOTE 03/29/2013  Patient:  [name], [name]   Account Number:  [id number]  Date Initiated:  03/29/2013  Documentation initiated by:  Sharrie Rothman  Subjective/Objective Assessment:   Pt admitted from home with CP. Pt will return home.     Action/Plan:   Pt has no insurance. MATCH voucher given for medication assistance.   Anticipated DC Date:  03/29/2013   Anticipated DC Plan:  HOME/SELF CARE      DC Planning Services  CM consult  MATCH Program      Choice offered to / List presented to:             Status of service:  Completed, signed off Medicare Important Message given?   (If response is "NO", the following Medicare IM given date fields will be blank) Date Medicare IM given:   Date Additional Medicare IM given:    Discharge Disposition:  HOME/SELF CARE  Per UR Regulation:    If discussed at Long Length of Stay Meetings, dates discussed:    Comments:  03/29/13 1500 Arlyss Queen, RN BSN CM

## 2013-03-29 NOTE — Progress Notes (Signed)
NAMEMUJTABA, BOLLIG NO.:  [id number]  MEDICAL RECORD NO.:  [id number]  LOCATION:  A336                          FACILITY:  [facility]  PHYSICIAN:  Melvyn Novas, MDDATE OF BIRTH:  [birth year]  DATE OF PROCEDURE: DATE OF DISCHARGE:                                PROGRESS NOTE   A 37 year old, Latino male with history of either ASD or VSD repair of his heart 15 years ago.  Likewise had a __________ history until 15 years ago.  Reports sharp chest pain occurring over the preceding 2 days, possibly with a pleuritic component.  He also described left-sided pressure pain which lasted less than 30 seconds which did scare him.  He denies any associated dyspnea, orthopnea, PND, palpitations, dizziness, or syncope.  He reports to the hospital.  The patient does have a high degree of anxiety with possible panic disorder.  He is known to me for several months only.  Cardiac markers are negative.  ProBNP is within normal limits.  Chest x-ray shows no evidence of volume overload or ventricular dysfunction.  Blood pressure at present is 99/46, pulse is 56 and regular, temperature is 97.9, respiratory rate is 18.  He does have diabetes which is suboptimally controlled for 7 years and non- insulin dependent.  The patient does not smoke cigarettes.  His lipid status is unknown.  LUNGS:  Clear.  No rales, wheeze, or rhonchi. HEART:  Regular rhythm.  No S3, S4.  No heaves, thrills, or rubs. ABDOMEN:  Soft, nontender.  Bowel sounds normoactive.  IMPRESSION: 1. Atypical chest pain. 2. High degree of anxiety. 3. Non-insulin-dependent diabetes. 4. Unknown lipid status. 5. Status post ASD or VSD repair unknown at [facility].  We will     attempt to get records.  The plan right now is to consider early discharge by tomorrow morning. The patient should ambulate in the hallways to see if any chest pain occurs or recurs, whether it is typical or atypical.  We will do  lipid panel today and make further recommendations as the database expands.     Melvyn Novas, MD     RMD/MEDQ  D:  03/28/2013  T:  03/29/2013  Job:  [id number]

## 2013-03-29 NOTE — Progress Notes (Signed)
Spoke with patient about diabetes and home regimen for diabetes control. Patient reports that he is suppose to be taking 3 different oral medications for diabetes control.  However, he is not able to work right now and is not able to afford his diabetic medications.  Discussed A1C results (9.6% on 03/28/13) and explained what an A1C is, basic pathophysiology of DM Type 2, basic home care, importance of checking CBGs and maintaining good CBG control to prevent long-term and short-term complications.   Patient reports that he has been to the health department in the past but when he last went he was told that they could no longer help him and that he needed to apply for help with the Social Service.  He did go to the [facility] but since he did not have a Tree surgeon card or a green card they were unable to help him.  He also reports that he went to a clinic (not sure of name) in [place] in the past but he was told he could not be seen there because he lived in [place].  Informed patient about the [facility] and [facility] and provided him with contact information in case he needs to go there due to cost of PCP visit and to see if he can receive medication assistance.  Have ordered Living Well With Diabetes educational booklet in Spanish and patient education videos on diabetes.   Will request that Case Manage see patient and see if patient can receive medication assistance vouchers for discharge medications.   Patient verbalized understanding of information discussed and states that he does not have any further questions at this time.   Thanks, Orlando Penner, RN, MSN, CCRN Diabetes Coordinator Inpatient Diabetes Program [phone] (Team Pager) [phone] ([facility] office) [phone] [facility] office)

## 2013-03-30 NOTE — Discharge Summary (Signed)
[name], [name] NO.:  [id number]  MEDICAL RECORD NO.:  [id number]  LOCATION:  A336                          FACILITY:  [facility]  PHYSICIAN:  Melvyn Novas, MDDATE OF BIRTH:  1976-04-16  DATE OF ADMISSION:  03/27/2013 DATE OF DISCHARGE:  12/03/2014LH                              DISCHARGE SUMMARY   The patient is a 37 year old Latino male with a history of either ASD or VSD repair 15 years ago at [facility] with history of anxiety status post ethanolism, has been sober for 15 years with type 2 diabetes with fair control.  He was admitted to the hospital with left-sided atypical chest pain, nonexertional, seen in hospital.  His pain subsided and his cardiac enzymes were negative.  He was hemodynamically stable throughout.  He has fair glycemic control on carb-modified diet.  The patient had a 2D echo, which revealed mild left to right flow in the atrial septum consistent with PFO and mild right-sided volume overload stigmata with otherwise normal chamber size and contractility.  There was no definite evidence of ASD or VSD repair based on echocardiographic criteria.  The patient was ambulated in the hallway, had no exertional component of his chest pain and was felt safe to discharge after being seen by Cardiology who felt the pain was likewise atypical.  He was cautioned to follow up with Cardiology and myself within 1 week's time. His discharge medicines include aspirin 81 mg p.o. daily; nitroglycerin 0.4 mg tablets one sublingual p.r.n. for chest pain.  Told to follow up in the ER if chest pain recurs.  He likewise was discharged on metformin 500 mg p.o. b.i.d. for diabetic control and his lipid status is measured in the hospital and was not back at the time of this discharge.  I will look that up later.     Melvyn Novas, MD     RMD/MEDQ  D:  03/29/2013  T:  03/30/2013  Job:  [id number]

## 2013-04-25 ENCOUNTER — Encounter: Payer: Self-pay | Admitting: Cardiology

## 2013-05-09 ENCOUNTER — Telehealth: Payer: Self-pay | Admitting: *Deleted

## 2013-05-09 DIAGNOSIS — Z01818 Encounter for other preprocedural examination: Secondary | ICD-10-CM

## 2013-05-09 NOTE — Telephone Encounter (Signed)
Message copied by Ovidio KinPULLIAM, Diesha Rostad A on Tue May 09, 2013  5:35 PM ------      Message from: Ashley AkinSTEWART, TERRY G      Created: Tue May 09, 2013  1:34 PM      Regarding: TEE        Please call patient with instructions and date and time of procedure.  Dr.Branch wanted this ordered post hosp visit from 03/29/13.  Have not been able to get in contact with patient.  Letter was sent and he is finally returning call.  You can see note in chart.              Patient is scheduled from Friday 05/26/13 @ 0930.  Patient needs to arrive @ [facility] @ 0830 to register.                  Thanks,      Aurther Lofterry ------

## 2013-05-09 NOTE — Telephone Encounter (Signed)
.  left message to have patient return my call.  

## 2013-05-11 ENCOUNTER — Encounter: Payer: Self-pay | Admitting: *Deleted

## 2013-05-11 NOTE — Telephone Encounter (Signed)
Pt son called to advise that pt does not understand english, unable to locate [facility] interpreter, vm left for Ms. Bradly BienenstockMartinez to contact office, pt son advised about procedure and instructions mailed to pt, pt son then interrupted to pt the procedure instructions/date/time and labs to be drawn, pt son stated the pt did understand his interpretation about TEE procedure, also advised instructions/lab slips are in the mail to verified address in pt chart, this nurse asked pt son if he or the pt had any more questions, the son interpreted to the pt and returned a response of "NO, I understand everything" this nurse informed the pt son to call back to our office with any further questions if needed. Pt son understood.

## 2013-05-11 NOTE — Telephone Encounter (Signed)
ZOX:[id number]:Called number in pt chart and spoke to [facility]aria whom advised this number belongs to her and the pt is her brother n law and was given correct number of [phone], called number provided and left vm of importance to contact our office. This nurse also mailed out a certified letter to the address in the pt chart with instructions, lab slips and apt information to be sent to the pt home and to be signed for as well as instructions to call our office to confirm he received the information

## 2013-05-12 ENCOUNTER — Telehealth: Payer: Self-pay | Admitting: *Deleted

## 2013-05-12 NOTE — Telephone Encounter (Signed)
JYN:WGNFAFYI:Terry Kathaleen BuryMilena Craig Interpreter for [facility] came into the office per vm left from this nurse concerning pt upcoming TEE and ability to understand all instructions/procedure information provided to pt teenage son via telephone and letter sent to pt yesterday, [name] advised pt apt/instructions and procedure information and pt contact information, she advised she will call to ensure the pt understood all instructions and that she will be meet him at Short Stay the day of his TEE to assist with the interpretation, pt son made aware yesterday and interpreter will be contacting the pt. [name] and pt will call our office with any concerns that may arise in the future

## 2013-05-12 NOTE — Telephone Encounter (Signed)
Thank you, please let me know what day he is scheduled for when it becomes available.   Dina RichJonathan Sharolyn Weber MD

## 2013-05-12 NOTE — Telephone Encounter (Signed)
05-26-13 at 9:30am [facility] at 8:30am

## 2013-05-15 NOTE — Telephone Encounter (Signed)
Noted pt household member signed for certified letter with instructions/letter/lab slips with receipt number [id number] [phone]

## 2013-05-22 NOTE — Telephone Encounter (Signed)
Noted/disregaurded

## 2013-05-22 NOTE — Telephone Encounter (Signed)
Please let patient know that we received a notice that medicaid di not approve losartan 100mg  daily for him. Is he ok if we switch him to lisinopril, which is an alternative   Dina RichJonathan Mia Winthrop MD

## 2013-05-22 NOTE — Telephone Encounter (Signed)
The notice about the losartan is entered in error, disregard  Dina RichJonathan Mallorie Norrod MD

## 2013-05-25 ENCOUNTER — Other Ambulatory Visit: Payer: Self-pay | Admitting: Cardiology

## 2013-05-25 DIAGNOSIS — Q211 Atrial septal defect, unspecified: Secondary | ICD-10-CM

## 2013-05-26 ENCOUNTER — Encounter (HOSPITAL_COMMUNITY)
Admission: RE | Disposition: A | Discharge status: Home / Self Care | Payer: Self-pay | Source: Ambulatory Visit | Attending: Cardiology

## 2013-05-26 ENCOUNTER — Ambulatory Visit (HOSPITAL_COMMUNITY): Payer: Self-pay

## 2013-05-26 ENCOUNTER — Encounter: Payer: Self-pay | Admitting: *Deleted

## 2013-05-26 ENCOUNTER — Other Ambulatory Visit: Payer: Self-pay | Admitting: *Deleted

## 2013-05-26 ENCOUNTER — Encounter (HOSPITAL_COMMUNITY): Payer: Self-pay | Admitting: *Deleted

## 2013-05-26 ENCOUNTER — Ambulatory Visit (HOSPITAL_COMMUNITY)
Admission: RE | Admit: 2013-05-26 | Discharge: 2013-05-26 | Disposition: A | Discharge status: Home / Self Care | Payer: Self-pay | Source: Ambulatory Visit | Attending: Cardiology | Admitting: Cardiology

## 2013-05-26 DIAGNOSIS — Q211 Atrial septal defect: Secondary | ICD-10-CM | POA: Insufficient documentation

## 2013-05-26 DIAGNOSIS — Q2111 Secundum atrial septal defect: Secondary | ICD-10-CM | POA: Insufficient documentation

## 2013-05-26 DIAGNOSIS — I071 Rheumatic tricuspid insufficiency: Secondary | ICD-10-CM | POA: Insufficient documentation

## 2013-05-26 DIAGNOSIS — E119 Type 2 diabetes mellitus without complications: Secondary | ICD-10-CM | POA: Insufficient documentation

## 2013-05-26 DIAGNOSIS — Z01812 Encounter for preprocedural laboratory examination: Secondary | ICD-10-CM | POA: Insufficient documentation

## 2013-05-26 DIAGNOSIS — R079 Chest pain, unspecified: Secondary | ICD-10-CM | POA: Insufficient documentation

## 2013-05-26 DIAGNOSIS — I369 Nonrheumatic tricuspid valve disorder, unspecified: Secondary | ICD-10-CM

## 2013-05-26 DIAGNOSIS — Z7982 Long term (current) use of aspirin: Secondary | ICD-10-CM | POA: Insufficient documentation

## 2013-05-26 DIAGNOSIS — Z794 Long term (current) use of insulin: Secondary | ICD-10-CM | POA: Insufficient documentation

## 2013-05-26 HISTORY — DX: Atrial septal defect: Q21.1

## 2013-05-26 HISTORY — PX: TEE WITHOUT CARDIOVERSION: SHX5443

## 2013-05-26 LAB — GLUCOSE, CAPILLARY: Glucose-Capillary: 292 mg/dL — ABNORMAL HIGH (ref 70–99)

## 2013-05-26 SURGERY — ECHOCARDIOGRAM, TRANSESOPHAGEAL
Anesthesia: Moderate Sedation

## 2013-05-26 MED ORDER — LIDOCAINE VISCOUS 2 % MT SOLN
OROMUCOSAL | Status: DC | PRN
  Administered 2013-05-26: 20 mL via OROMUCOSAL

## 2013-05-26 MED ORDER — MIDAZOLAM HCL 5 MG/5ML IJ SOLN
INTRAMUSCULAR | Status: DC | PRN
  Administered 2013-05-26: 0.5 mg via INTRAVENOUS
  Administered 2013-05-26 (×2): 1 mg via INTRAVENOUS

## 2013-05-26 MED ORDER — PAROXETINE HCL 20 MG PO TABS: 20.0000 mg | ORAL_TABLET | Freq: Every day | ORAL | Status: DC

## 2013-05-26 MED ORDER — NITROGLYCERIN 0.4 MG SL SUBL: 0.4000 mg | SUBLINGUAL_TABLET | SUBLINGUAL | Status: DC | PRN

## 2013-05-26 MED ORDER — SODIUM CHLORIDE BACTERIOSTATIC 0.9 % IJ SOLN
INTRAMUSCULAR | Status: AC
  Filled 2013-05-26: qty 10

## 2013-05-26 MED ORDER — MIDAZOLAM HCL 5 MG/5ML IJ SOLN
INTRAMUSCULAR | Status: AC
  Filled 2013-05-26: qty 5

## 2013-05-26 MED ORDER — LIDOCAINE VISCOUS 2 % MT SOLN
OROMUCOSAL | Status: AC
  Filled 2013-05-26: qty 15

## 2013-05-26 MED ORDER — FENTANYL CITRATE 0.05 MG/ML IJ SOLN
INTRAMUSCULAR | Status: AC
  Filled 2013-05-26: qty 2

## 2013-05-26 MED ORDER — METFORMIN HCL 500 MG PO TABS: 500.0000 mg | ORAL_TABLET | Freq: Two times a day (BID) | ORAL | Status: DC

## 2013-05-26 MED ORDER — FENTANYL CITRATE 0.05 MG/ML IJ SOLN
INTRAMUSCULAR | Status: DC | PRN
  Administered 2013-05-26: 25 ug via INTRAVENOUS
  Administered 2013-05-26: 50 ug via INTRAVENOUS

## 2013-05-26 MED ORDER — SODIUM CHLORIDE 0.9 % IV SOLN
INTRAVENOUS | Status: DC
  Administered 2013-05-26: 09:00:00 via INTRAVENOUS

## 2013-05-26 NOTE — Procedures (Signed)
Transesophageal echo  Patient brought to endoscopy suite after appropriate consent was obtained, the patient is spanish speaking, a spanish speaking medical interpreter was present througout the procedure. The oropharynx was anesthesized with viscous lidocaine and cetacaine spray. A total of 2.5 mg of versed and 75 mcg of fentanyl were used throughout the procedure for moderate sedation. The TEE probe was intubated without difficulty, the patients vitals were monitored continously. Follow up official TEE report. 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    Dina RichJonathan Kaelob Persky MD

## 2013-05-26 NOTE — Progress Notes (Signed)
D/C instructions reviewed with pt and wife. Questions answered. Voiced understanding. Instructions also printed in spanish and given to pt and wife. Voiced understanding. Escorted per Rosey Batheresa to Dr Branches office for RX per Dr Branches  request.

## 2013-05-26 NOTE — Progress Notes (Signed)
Spanish interpreter at side. Wife at side. Questions answered. Pt states okay with interpreter leaving. Can communicate well.

## 2013-05-26 NOTE — Progress Notes (Signed)
*  PRELIMINARY RESULTS* Echocardiogram Echocardiogram Transesophageal has been performed.  [name] 05/26/2013, 11:31 AM

## 2013-05-26 NOTE — OR Nursing (Signed)
Terry BuryMilena Craig, [facility] interpreter here to interpret for patient.
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
## 2013-05-26 NOTE — Discharge Instructions (Signed)
Transesophageal Echocardiography Transesophageal echocardiography (TEE) is a picture test of your heart using sound waves. The pictures taken can give very detailed pictures of your heart. This can help your doctor see if there are problems with your heart. TEE can check:  If your heart has blood clots in it.  How well your heart valves are working.  If you have an infection on the inside of your heart.  Some of the major arteries of your heart.  If your heart valve is working after a Psychologist, forensic.  Your heart before a procedure that uses a shock to your heart to get the rhythm back to normal. BEFORE THE PROCEDURE  Do not eat or drink for 6 hours before the procedure or as told by your doctor.  Make plans to have someone drive you home after the procedure. Do not drive yourself home.  An IV tube will be put in your arm. PROCEDURE  You will be given a medicine to help you relax (sedative). It will be given through the IV tube.  A numbing medicine will be sprayed in the back of your throat to help numb it.  The tip of the probe is placed into the back of your mouth. You will be asked to swallow. This helps to pass the probe into your esophagus.  Once the tip of the probe is in the right place, your doctor can take pictures of your heart.  You may feel pressure at the back of your throat. AFTER THE PROCEDURE  You will be taken to a recovery area so the sedative can wear off.  Your throat may be sore and scratchy. This will go away slowly over time.  You will go home when you are fully awake and able to swallow liquids.  You should have someone stay with you for the next 24 hours. Document Released: 02/08/2009 Document Revised: 02/01/2013 Document Reviewed: 10/13/2012 [facility] Patient Information 2014 [place], [place]. Ecocardiograma transesofgico (Transesophageal Echocardiography) El ecocardiograma transesofgico (ETE) es una estudio de imgenes de su corazn que se realiza  mediante ondas de sonido. Las [employer] se obtienen del corazn son Librarian, academic. Esto puede ayudarle al mdico a detectar si hay problemas con su corazn. El ETE puede controlar lo siguiente:  Si hay cogulos de Advertising copywriter.  Si las vlvulas cardacas estn funcionando bien.  Si tiene una infeccin en la parte interna del corazn.  Algunas de las arterias principales del corazn.  Si la vlvula cardaca funciona bien despus de una reparacin.  Su corazn antes de un procedimiento que [place] un choque elctrico para que el ritmo cardaco vuelva a lo normal. ANTES DEL PROCEDIMIENTO  No coma ni beba nada durante las 6 horas previas al procedimiento, o segn le haya indicado el mdico.  Planifique para que alguien lo lleve de vuelta a su casa. No  conduzca usted mismo de regreso a Pensions consultant.  Le insertarn una va intravenosa en el brazo. PROCEDIMIENTO  Le administrarn un medicamento para ayudarlo a relajarse (sedante). Se lo administrarn a travs de la va intravenosa.  Le colocarn anestsicos en la parte posterior de la garganta para adormecerla.  La punta de la sonda se coloca en la parte posterior de la boca. Le pedirn que trague. Esto ayudar a que la sonda pase hacia el esfago.  Una vez que la punta de la sonda est en el lugar correcto, el mdico podr captar imgenes del corazn.  Es probable que sienta una presin en la parte  posterior de la garganta. DESPUS DEL PROCEDIMIENTO  Lo llevarn a un rea de recuperacin hasta que el efecto del sedante haya desaparecido.  Es posible que sienta la garganta dolorida y con picazn. Esto mejorar lentamente con Allied Waste Industriesel tiempo.  Regresar a su casa cuando est totalmente despierto y pueda tragar lquidos.  Deber solicitarle a alguien que se quede con usted durante las siguientes 24 horas. Document Released: 05/16/2010 Document Revised: 02/01/2013 [facility] Patient Information 2014 [place], [place].

## 2013-05-29 ENCOUNTER — Encounter (HOSPITAL_COMMUNITY): Payer: Self-pay | Admitting: Cardiology

## 2013-05-31 ENCOUNTER — Telehealth: Payer: Self-pay | Admitting: Cardiovascular Disease

## 2013-05-31 NOTE — Telephone Encounter (Signed)
[facility] x[facility] Years ago Dr.Cooper is asking to Review These.

## 2013-06-16 ENCOUNTER — Encounter: Payer: Self-pay | Admitting: Cardiovascular Disease

## 2013-06-16 ENCOUNTER — Encounter: Payer: Self-pay | Admitting: *Deleted

## 2013-06-16 ENCOUNTER — Ambulatory Visit (INDEPENDENT_AMBULATORY_CARE_PROVIDER_SITE_OTHER): Payer: Self-pay | Admitting: Cardiovascular Disease

## 2013-06-16 VITALS — BP 120/80 | HR 72 | Ht 65.0 in | Wt 172.0 lb

## 2013-06-16 DIAGNOSIS — Q211 Atrial septal defect, unspecified: Secondary | ICD-10-CM

## 2013-06-16 DIAGNOSIS — Q2111 Secundum atrial septal defect: Secondary | ICD-10-CM

## 2013-06-16 HISTORY — DX: Atrial septal defect, unspecified: Q21.10

## 2013-06-16 HISTORY — DX: Atrial septal defect: Q21.1

## 2013-06-16 LAB — CBC WITH DIFFERENTIAL/PLATELET
BASOS PCT: 0 % (ref 0–1)
Basophils Absolute: 0 10*3/uL (ref 0.0–0.1)
EOS ABS: 0.2 10*3/uL (ref 0.0–0.7)
EOS PCT: 2 % (ref 0–5)
HEMATOCRIT: 46.7 % (ref 39.0–52.0)
Hemoglobin: 16.4 g/dL (ref 13.0–17.0)
Lymphocytes Relative: 26 % (ref 12–46)
Lymphs Abs: 2.3 10*3/uL (ref 0.7–4.0)
MCH: 32.7 pg (ref 26.0–34.0)
MCHC: 35.1 g/dL (ref 30.0–36.0)
MCV: 93 fL (ref 78.0–100.0)
MONO ABS: 0.7 10*3/uL (ref 0.1–1.0)
Monocytes Relative: 8 % (ref 3–12)
NEUTROS ABS: 5.6 10*3/uL (ref 1.7–7.7)
Neutrophils Relative %: 64 % (ref 43–77)
Platelets: 266 10*3/uL (ref 150–400)
RBC: 5.02 MIL/uL (ref 4.22–5.81)
RDW: 12.7 % (ref 11.5–15.5)
WBC: 8.7 10*3/uL (ref 4.0–10.5)

## 2013-06-16 LAB — BASIC METABOLIC PANEL
BUN: 11 mg/dL (ref 6–23)
CHLORIDE: 97 meq/L (ref 96–112)
CO2: 25 meq/L (ref 19–32)
Calcium: 8.7 mg/dL (ref 8.4–10.5)
Creat: 0.6 mg/dL (ref 0.50–1.35)
GLUCOSE: 409 mg/dL — AB (ref 70–99)
Potassium: 4.1 mEq/L (ref 3.5–5.3)
SODIUM: 131 meq/L — AB (ref 135–145)

## 2013-06-16 LAB — PROTIME-INR
INR: 1.22 (ref <1.50)
Prothrombin Time: 15.2 seconds (ref 11.6–15.2)

## 2013-06-16 MED ORDER — CLOPIDOGREL BISULFATE 75 MG PO TABS: 75.0000 mg | ORAL_TABLET | Freq: Every day | ORAL | Status: DC

## 2013-06-16 NOTE — Progress Notes (Signed)
HPI:   38 year old gentleman presenting for evaluation of atrial septal defect. The patient has been told heart murmur since he was a child. He is originally from [place]. When he was in his early 55s he began to notice marked shortness of breath with activity. He was diagnosed with a large secundum ASD and he underwent open surgical repair with a pericardial patch. The operative note describes a 4 cm diameter atrial septal defect at that time. He then did well for many years until the last 6-12 months when he has developed progressive shortness of breath with activity. He was hospitalized in December 2014 with chest pain and shortness of breath. An echocardiogram demonstrated an enlarged right heart with left to right flow across the interatrial septum. A TEE was done and this confirmed a large secundum ASD at the site of previous surgical repair.  The patient is interviewed today with an interpreter. He describes progressive shortness of breath now with minimal activity. He also has frequent palpitations and cough. He describes lightheadedness but has had no episodes of syncope. He denies chest pain or pressure with exertion. He can only walk short distances without significant dyspnea. He denies orthopnea, PND, or leg swelling.  The patient has previously worked in [employer]. He has 5 children, ages ranging from 25 years old to 40 years old. He has not smoked cigarettes or drink alcohol in well over a decade. He was a heavy alcohol drinker in his early 65s. There is no family history of heart disease, either congenital or atherosclerotic coronary disease. Other medical comorbidities include type 2 diabetes.  Outpatient Encounter Prescriptions as of 06/16/2013  Medication Sig  . aspirin EC 81 MG EC tablet Take 1 tablet (81 mg total) by mouth daily.  . metFORMIN (GLUCOPHAGE) 500 MG tablet Take 1 tablet (500 mg total) by mouth 2 (two) times daily with a meal.  . nitroGLYCERIN (NITROSTAT) 0.4 MG SL  tablet Place 1 tablet (0.4 mg total) under the tongue every 5 (five) minutes x 3 doses as needed for chest pain.  Marland Kitchen PARoxetine (PAXIL) 20 MG tablet Take 1 tablet (20 mg total) by mouth daily.    Review of patient's allergies indicates no known allergies.  Past Medical History  Diagnosis Date  . Type 2 diabetes mellitus   . Atrial septal defect     Past Surgical History  Procedure Laterality Date  . Possible asd or vsd repair      [facility] - 15 years ago  . Liver surgery    . Back surgery  2011  . Appendectomy  2012  . Tee without cardioversion N/A 05/26/2013    Procedure: TRANSESOPHAGEAL ECHOCARDIOGRAM (TEE);  Surgeon: Antoine Poche, MD;  Location: [facility];  Service: Cardiology;  Laterality: N/A;    History   Social History  . Marital Status: Single    Spouse Name: N/A    Number of Children: N/A  . Years of Education: N/A   Occupational History  . Not on file.   Social History Main Topics  . Smoking status: Never Smoker   . Smokeless tobacco: Not on file  . Alcohol Use: No     Comment: History of alcohol abuse  . Drug Use: No  . Sexual Activity: Not on file   Other Topics Concern  . Not on file   Social History Narrative  . No narrative on file    Family History  Problem Relation Age of Onset  . Diabetes Mellitus II  ROS:  General: no fevers/chills/night sweats Eyes: no blurry vision, diplopia, or amaurosis ENT: no sore throat or hearing loss Resp: no  wheezing, or hemoptysis, otherwise see history of present illness CV: See history of present illness GI: no abdominal pain, nausea, vomiting, diarrhea, or constipation GU: no dysuria, frequency, or hematuria Skin: no rash Neuro: no headache, numbness, tingling, or weakness of extremities Musculoskeletal: no joint pain or swelling Heme: no bleeding, DVT, or easy bruising Endo: no polydipsia or polyuria  BP 120/80  Pulse 72  Ht 5\' 5"  (1.651 m)  Wt 172 lb (78.019 kg)  BMI 28.62 kg/m2  SpO2  97%  PHYSICAL EXAM: Pt is alert and oriented, WD, WN, in no distress. HEENT: normal Neck: JVP normal. Carotid upstrokes normal without bruits. No thyromegaly. Lungs: equal expansion, clear bilaterally CV: Apex is discrete and nondisplaced, RRR with a grade 2/6 systolic murmur at the left sternal border and a widely split S2  Abd: soft, NT, +BS, no bruit, no hepatosplenomegaly Back: no CVA tenderness Ext: no C/C/E        DP/PT pulses intact and = Skin: warm and dry without rash Neuro: CNII-XII intact             Strength intact = bilaterally  2-D echocardiogram 03/28/2013:  ------------------------------------------------------------ Study Conclusions  - Left ventricle: The cavity size was normal. Wall thickness was increased in a pattern of mild LVH. Systolic function was normal. The estimated ejection fraction was in the range of 60% to 65%. Wall motion was normal; there were no regional wall motion abnormalities. No evidence of VSD. Features are consistent with a pseudonormal left ventricular filling pattern, with concomitant abnormal relaxation and increased filling pressure (grade 2 diastolic dysfunction). - Ventricular septum: The contour showed diastolic flattening and systolic flattening consistent with increased RV pressure and volume. - Mitral valve: Trivial regurgitation. - Left atrium: The atrium was mildly dilated. - Right ventricle: The cavity size was severely dilated. - Right atrium: The atrium was severely dilated. - Atrial septum: No definite evidence of repair. Suggestive of secundum ASD or PFO. The septum bowed from left to right, consistent with increased left atrial pressure. There was a left-to-right shunt. - Tricuspid valve: Dilated annulus with mild prolapse of the anterior leaflet. Moderate regurgitation. - Pulmonary arteries: PA peak pressure: 42mm Hg (S). - Pericardium, extracardiac: There was no pericardial effusion. Impressions:  - No prior  study available for comparison. Mild LVH with LVEF 60-65%, grade 2 diastolic dysfunction. Mild left atrial enlargement. History indicates prior repair of "hole in heart." There is evidence of RV pressure and volume overload with severe RV enlargement as well. Possible secundum ASD or PFO without definitive evidence of repair. There is a left to right shunt by color Doppler. Coronary sinus appears dilated. No VSD visualized. Severe right atrial enlargement. Moderate tricuspid regurgitation with PASP 42 mmHg. Suggest obtaining prior operative report to further clarify.  Transesophageal echocardiogram 05/26/2013: Study Conclusions  - Study data: Technically adequate study. - Left ventricle: The cavity size was normal. Wall thickness was normal. Systolic function was normal. The estimated ejection fraction was in the range of 55% to 60%. Doppler parameters are consistent with abnormal left ventricular relaxation (grade 1 diastolic dysfunction). - Left atrium: No evidence of thrombus in the atrial cavity or appendage. - Right ventricle: The cavity size was severely dilated. Systolic function was moderately reduced. - Right atrium: The atrium was severely dilated. - Atrial septum: There is a secundum ASD with significant left to right  shunt by color Doppler. The width of the defect is approximately 1.6 cm. Technically unable to obtain RVOT and LVOT Doppler images, not able to calculate Qp/Qs. - Tricuspid valve: Moderate-severe regurgitation. The regurgitation appears to be secondary to poor leaflet coaptation due to severe tricuspid anular dilatation.  ASSESSMENT AND PLAN: Secundum ASD. This is a 38 year old gentleman with a large secundum ASD and evidence of RV volume overload on echocardiography. I have reviewed his surface echocardiogram and his TEE images in detail. His ASD measures about 16 mm in diameter by TEE. He appears to have sufficient tissue rims for consideration of  transcatheter closure. He also appears to have a right atrial membrane without evidence of turbulent flow around this. He clearly has significant hemodynamic impact of his atrial septal defect. It appears that part of the surgical patch has become deficient. Will further interrogate the right atrial membrane with intracardiac echo and hemodynamic study at the time of his right heart catheterization. I don't anticipate that this will interfere with device closure of his ASD.  I have reviewed all considerations around transcatheter ASD closure with the patient. I have discussed specific risks, including vascular injury, bleeding, stroke, myocardial infarction, cardiac perforation, cardiac tamponade, emergency cardiac surgery, device embolization, endocarditis, and death. The patient understands these risks are low and occur at a frequency of approximately 1%. He will undergo right heart catheterization and transcatheter ASD closure under guidance of intracardiac echocardiography. The procedure is tentatively scheduled for March 3. He will be started on Plavix 75 mg daily. All of his questions have been answered.  [name] 06/16/2013 6:10 PM

## 2013-06-16 NOTE — Patient Instructions (Addendum)
You are scheduled for an ASD closure with Dr. Excell Seltzerooper on June 27, 2013.  Please see instruction sheet.   Your physician has recommended you make the following change in your medication:  Start Clopidogrel 75 mg by mouth daily

## 2013-06-21 ENCOUNTER — Other Ambulatory Visit: Payer: Self-pay | Admitting: *Deleted

## 2013-06-21 MED ORDER — METFORMIN HCL 1000 MG PO TABS: 1000.0000 mg | ORAL_TABLET | Freq: Two times a day (BID) | ORAL | Status: AC

## 2013-06-23 ENCOUNTER — Encounter (HOSPITAL_COMMUNITY): Payer: Self-pay | Admitting: Pharmacy Technician

## 2013-06-27 ENCOUNTER — Encounter (HOSPITAL_COMMUNITY)
Admission: RE | Disposition: A | Discharge status: Home / Self Care | Payer: Self-pay | Source: Ambulatory Visit | Attending: Cardiovascular Disease

## 2013-06-27 ENCOUNTER — Ambulatory Visit (HOSPITAL_COMMUNITY)
Admission: RE | Admit: 2013-06-27 | Discharge: 2013-06-27 | Disposition: A | Discharge status: Home / Self Care | Payer: Self-pay | Source: Ambulatory Visit | Attending: Cardiovascular Disease | Admitting: Cardiovascular Disease

## 2013-06-27 DIAGNOSIS — Z9889 Other specified postprocedural states: Secondary | ICD-10-CM | POA: Insufficient documentation

## 2013-06-27 DIAGNOSIS — Z7982 Long term (current) use of aspirin: Secondary | ICD-10-CM | POA: Insufficient documentation

## 2013-06-27 DIAGNOSIS — Q2111 Secundum atrial septal defect: Secondary | ICD-10-CM | POA: Insufficient documentation

## 2013-06-27 DIAGNOSIS — Q211 Atrial septal defect: Secondary | ICD-10-CM | POA: Insufficient documentation

## 2013-06-27 DIAGNOSIS — Q219 Congenital malformation of cardiac septum, unspecified: Secondary | ICD-10-CM

## 2013-06-27 DIAGNOSIS — E119 Type 2 diabetes mellitus without complications: Secondary | ICD-10-CM | POA: Insufficient documentation

## 2013-06-27 HISTORY — PX: ASD REPAIR: SHX258

## 2013-06-27 LAB — POCT I-STAT 3, VENOUS BLOOD GAS (G3P V)
ACID-BASE DEFICIT: 1 mmol/L (ref 0.0–2.0)
ACID-BASE DEFICIT: 3 mmol/L — AB (ref 0.0–2.0)
Acid-base deficit: 1 mmol/L (ref 0.0–2.0)
BICARBONATE: 22.9 meq/L (ref 20.0–24.0)
BICARBONATE: 24.4 meq/L — AB (ref 20.0–24.0)
Bicarbonate: 25.1 mEq/L — ABNORMAL HIGH (ref 20.0–24.0)
O2 SAT: 100 %
O2 SAT: 87 %
O2 Saturation: 87 %
PCO2 VEN: 42.8 mmHg — AB (ref 45.0–50.0)
PH VEN: 7.364 — AB (ref 7.250–7.300)
PO2 VEN: 189 mmHg — AB (ref 30.0–45.0)
PO2 VEN: 59 mmHg — AB (ref 30.0–45.0)
TCO2: 24 mmol/L (ref 0–100)
TCO2: 26 mmol/L (ref 0–100)
TCO2: 27 mmol/L (ref 0–100)
pCO2, Ven: 45.1 mmHg (ref 45.0–50.0)
pCO2, Ven: 47.3 mmHg (ref 45.0–50.0)
pH, Ven: 7.314 — ABNORMAL HIGH (ref 7.250–7.300)
pH, Ven: 7.333 — ABNORMAL HIGH (ref 7.250–7.300)
pO2, Ven: 58 mmHg — ABNORMAL HIGH (ref 30.0–45.0)

## 2013-06-27 LAB — POCT ACTIVATED CLOTTING TIME
Activated Clotting Time: 210 seconds
Activated Clotting Time: 171 seconds

## 2013-06-27 LAB — GLUCOSE, CAPILLARY
GLUCOSE-CAPILLARY: 230 mg/dL — AB (ref 70–99)
Glucose-Capillary: 270 mg/dL — ABNORMAL HIGH (ref 70–99)
Glucose-Capillary: 165 mg/dL — ABNORMAL HIGH (ref 70–99)

## 2013-06-27 LAB — POCT I-STAT 3, ART BLOOD GAS (G3+)
Bicarbonate: 26.6 mEq/L — ABNORMAL HIGH (ref 20.0–24.0)
O2 Saturation: 75 %
PCO2 ART: 51.4 mmHg — AB (ref 35.0–45.0)
PH ART: 7.322 — AB (ref 7.350–7.450)
PO2 ART: 44 mmHg — AB (ref 80.0–100.0)
TCO2: 28 mmol/L (ref 0–100)

## 2013-06-27 SURGERY — REPAIR, ATRIAL SEPTAL DEFECT
Anesthesia: Moderate Sedation

## 2013-06-27 MED ORDER — SODIUM CHLORIDE 0.9 % IV SOLN: 1.0000 mL/kg/h | INTRAVENOUS | Status: DC

## 2013-06-27 MED ORDER — ASPIRIN 81 MG PO CHEW
81.0000 mg | CHEWABLE_TABLET | ORAL | Status: AC
  Administered 2013-06-27: 81 mg via ORAL
  Filled 2013-06-27: qty 1

## 2013-06-27 MED ORDER — HEPARIN (PORCINE) IN NACL 2-0.9 UNIT/ML-% IJ SOLN
INTRAMUSCULAR | Status: AC
  Filled 2013-06-27: qty 1000

## 2013-06-27 MED ORDER — SODIUM CHLORIDE 0.9 % IV SOLN
INTRAVENOUS | Status: DC
  Administered 2013-06-27: 08:00:00 via INTRAVENOUS

## 2013-06-27 MED ORDER — SODIUM CHLORIDE 0.9 % IJ SOLN: 3.0000 mL | INTRAMUSCULAR | Status: DC | PRN

## 2013-06-27 MED ORDER — HEPARIN SODIUM (PORCINE) 1000 UNIT/ML IJ SOLN
INTRAMUSCULAR | Status: AC
  Filled 2013-06-27: qty 1

## 2013-06-27 MED ORDER — LIDOCAINE HCL (PF) 1 % IJ SOLN
INTRAMUSCULAR | Status: AC
  Filled 2013-06-27: qty 30

## 2013-06-27 MED ORDER — MIDAZOLAM HCL 2 MG/2ML IJ SOLN
INTRAMUSCULAR | Status: AC
  Filled 2013-06-27: qty 2

## 2013-06-27 MED ORDER — NITROGLYCERIN 0.2 MG/ML ON CALL CATH LAB
INTRAVENOUS | Status: AC
  Filled 2013-06-27: qty 1

## 2013-06-27 MED ORDER — ACETAMINOPHEN 325 MG PO TABS: 650.0000 mg | ORAL_TABLET | ORAL | Status: DC | PRN

## 2013-06-27 MED ORDER — CLOPIDOGREL BISULFATE 75 MG PO TABS: 75.0000 mg | ORAL_TABLET | Freq: Once | ORAL | Status: DC

## 2013-06-27 MED ORDER — SODIUM CHLORIDE 0.9 % IJ SOLN: 3.0000 mL | Freq: Two times a day (BID) | INTRAMUSCULAR | Status: DC

## 2013-06-27 MED ORDER — ONDANSETRON HCL 4 MG/2ML IJ SOLN
4.0000 mg | Freq: Four times a day (QID) | INTRAMUSCULAR | Status: DC | PRN
Start: 2013-06-27 — End: 2013-06-27

## 2013-06-27 MED ORDER — SODIUM CHLORIDE 0.9 % IV SOLN: 250.0000 mL | INTRAVENOUS | Status: DC | PRN

## 2013-06-27 MED ORDER — DIAZEPAM 5 MG PO TABS
5.0000 mg | ORAL_TABLET | ORAL | Status: AC
  Administered 2013-06-27: 5 mg via ORAL
  Filled 2013-06-27: qty 1

## 2013-06-27 MED ORDER — OXYCODONE-ACETAMINOPHEN 5-325 MG PO TABS: 1.0000 | ORAL_TABLET | ORAL | Status: DC | PRN

## 2013-06-27 MED ORDER — FENTANYL CITRATE 0.05 MG/ML IJ SOLN
INTRAMUSCULAR | Status: AC
  Filled 2013-06-27: qty 2

## 2013-06-27 NOTE — Interval H&P Note (Signed)
History and Physical Interval Note:  06/27/2013 9:27 AM  [name]  has presented today for surgery, with the diagnosis of asd  The various methods of treatment have been discussed with the patient and family. After consideration of risks, benefits and other options for treatment, the patient has consented to  Procedure(s): ATRIAL SEPTAL DEFECT (ASD) closure (N/A) as a surgical intervention .  The patient's history has been reviewed, patient examined, no change in status, stable for surgery.  I have reviewed the patient's chart and labs.  Questions were answered to the patient's satisfaction.    Discussed plans again with patient and wife. He has been taking plavix as prescribed. All questions answered and they understand plan for right heart cath, ASD closure under guidance of intracardiac echo. I have reviewed procedural risks extensively at recent office visit.  [name]

## 2013-06-27 NOTE — Progress Notes (Signed)
Have been waiting for interpreter to give discharge instructions.  Given in english and spanish verbally and in writing to pt and wife by this RN and interpreter.  Both verbalize understanding.  Pt has an appointment set up with Dr. Cornelius Moraswen on March 13.  Family verbalizes  Understanding.

## 2013-06-27 NOTE — Discharge Instructions (Signed)
1. Stop taking Plavix  2. We will call you with an appointment time for a cardiac CT scan  3. The cardiac surgery office will call you with an appointment time Angiography, Care After Refer to this sheet in the next few weeks. These instructions provide you with information on caring for yourself after your procedure. Your health care provider may also give you more specific instructions. Your treatment has been planned according to current medical practices, but problems sometimes occur. Call your health care provider if you have any problems or questions after your procedure.  WHAT TO EXPECT AFTER THE PROCEDURE After your procedure, it is typical to have the following sensations:  Minor discomfort or tenderness and a small bump at the catheter insertion site. The bump should usually decrease in size and tenderness within 1 to 2 weeks.  Any bruising will usually fade within 2 to 4 weeks. HOME CARE INSTRUCTIONS   You may need to keep taking blood thinners if they were prescribed for you. Only take over-the-counter or prescription medicines for pain, fever, or discomfort as directed by your health care provider.  Do not apply powder or lotion to the site.  Do not sit in a bathtub, swimming pool, or whirlpool for 5 to 7 days.  You may shower 24 hours after the procedure. Remove the bandage (dressing) and gently wash the site with plain soap and water. Gently pat the site dry.  Inspect the site at least twice daily.  Limit your activity for the first 48 hours. Do not bend, squat, or lift anything over 20 lb (9 kg) or as directed by your health care provider.  Do not drive home if you are discharged the day of the procedure. Have someone else drive you. Follow instructions about when you can drive or return to work. SEEK MEDICAL CARE IF:  You get lightheaded when standing up.  You have drainage (other than a small amount of blood on the dressing).  You have chills.  You have a  fever.  You have redness, warmth, swelling, or pain at the insertion site. SEEK IMMEDIATE MEDICAL CARE IF:   You develop chest pain or shortness of breath, feel faint, or pass out.  You have bleeding, swelling larger than a walnut, or drainage from the catheter insertion site.  You develop pain, discoloration, coldness, or severe bruising in the leg or arm that held the catheter.  You develop bleeding from any other place, such as the bowels. You may see bright red blood in your urine or stools, or your stools may appear black and tarry.  You have heavy bleeding from the site. If this happens, hold pressure on the site. MAKE SURE YOU:  Understand these instructions.  Will watch your condition.  Will get help right away if you are not doing well or get worse. Document Released: 10/30/2004 Document Revised: 12/14/2012 Document Reviewed: 09/05/2012 [facility] Patient Information 2014 [place], [place]. Angiografa - Cuidados posteriores (Angiography, Care After) Siga estas instrucciones durante las prximas semanas. Estas indicaciones le proporcionan informacin general acerca de cmo deber cuidarse despus del procedimiento. El mdico tambin podr darle instrucciones ms especficas. El tratamiento se ha planificado de acuerdo a las prcticas mdicas actuales, pero a veces se producen problemas. Comunquese con el mdico si tiene algn problema o tiene dudas despus del procedimiento.  QU ESPERAR DESPUS DEL PROCEDIMIENTO Despus del procedimiento, es tpico tener las siguientes sensaciones:  Pequeas molestias o sensibilidad y un pequeo bulto en el sitio de insercin del  catter. En 1  2 semanas debe disminuir el tamao y la inflamacin del bulto.  Algn hematoma que generalmente desaparece despus de 2 a 4 semanas. INSTRUCCIONES PARA EL CUIDADO EN EL HOGAR   Posiblemente deba seguir tomando anticoagulantes, si se los recetaron. Slo tome medicamentos de venta libre o recetados para  Primary school teachercalmar el dolor, el malestar o bajar la fiebre, segn las indicaciones de su mdico.  No se aplique polvos ni lociones en el sitio.  No se siente en la baera, en la piscina ni en el jacuzzi por 5 a 7 das.  Podr darse Neomia Dearuna ducha 24 horas despus del procedimiento. Retire el apsito (vendaje )y lave suavemente la zona con agua y jabn comn. Seque con golpecitos suaves.  Inspeccione Immunologistel lugar al Rite Aidmenos dos veces por da.  Limite sus actividades durante las primeras 48 horas. No se incline, no se ponga en cuclillas ni levante objetos que pesen ms de 20 lb (9 kg) o lo que le indique el mdico.  No conduzca el automvil por sus propios medios si le dan el alta el mismo da. Pdale a otra persona que lo lleve. Siga las instrucciones acerca de cuando volver a conducir el automvil o volver a Printmakertrabajar. SOLICITE ATENCIN MDICA SI:  Se siente mareado al pararse.  Tiene un drenaje (que no es una pequea cantidad de sangre en el vendaje).  Tiene escalofros.  Tiene fiebre.  Observa irritacin, calor, hinchazn o dolor en el sitio de la insercin. SOLICITE ATENCIN MDICA DE INMEDIATO SI:   Comienza a sentir dolor en el pecho, le falta el aire, se siente mareado o se desmaya.  Hay sangrado, hinchazn ms grande que una nuez o drenaje en el lugar de la insercin del catter.  Siente dolor, observa un cambio en la coloracin, enfriamiento o un hematoma importante en la pierna o en el brazo donde el catter fue insertado.  Conception Omsbserva sangrado en algn otro lugar, como en los intestinos. Puede ser que observe sangre rojo brillante en la orina o en las heces, o que estas sean de color negro y aspecto alquitranado.  Sangra mucho en el sitio. Si esto ocurre, haga presin Medical laboratory scientific officeren el lugar. ASEGRESE DE QUE:  Comprende estas instrucciones.  Controlar su afeccin.  Recibir ayuda de inmediato si no mejora o si empeora. Document Released: 07/10/2008 Document Revised: 12/14/2012 [facility] Patient  Information 2014 [place], [place].

## 2013-06-27 NOTE — CV Procedure (Signed)
   Cardiac Catheterization Procedure Note  Name: [name] MRN: [id number] DOB: 27-Nov-1975  Procedure: Right Heart Catheterization, Intracardiac echocardiography  Indication: Atrial Septal Defect. This is a 38 year old Hispanic gentleman who underwent surgical [facility] of a large ASD [facility]. He presented with progressive shortness of breath and echocardiography demonstrated an enlarged right heart. The patient underwent a transesophageal echo demonstrating recurrence of his ASD. The defect measured approximately 15 mm in diameter and appeared to be in the midportion of the interatrial septum by TEE. He presents today for planned transcatheter closure of this defect.   Procedural details: The right groin was prepped, draped, and anesthetized with 1% lidocaine. Using modified Seldinger technique, an 8 French sheath was introduced into the right femoral vein and a 9 French sheath was introduced into the right femoral vein just distal to the original sheath. A Swan-Ganz catheter was used to draw oxygen saturations and record right heart pressures. An intracardiac echo probe was advanced into the right atrium and imaging was performed. The patient was given weight-based unfractionated heparin.  Intracardiac echo imaging demonstrated a deficient posterior rim of the ASD, functionally making this a primum ASD. Extensive imaging was performed. The defect was not suitable for transcatheter closure because of this deficient posterior rim. The intracardiac echo probe and Swan-Ganz catheter were removed and the patient was transferred to the recovery room in stable condition. His sheaths will be pulled once his ACT is less than 175 and manual pressure will be used for hemostasis.  Procedural Findings: Hemodynamics:  Hemodynamics: RA 6 RV 37/9 PA 33/11 with a mean of 21 Pulmonary capillary wedge pressure 10  Pulmonary artery saturation 87% Pulmonary  venous saturation 96%   Coronary angiography: Coronary dominance: right  Final Conclusions:   Interatrial septal defect, not suitable for transcatheter closure because of deficient posterior rim  Recommendations: Will obtain a gated cardiac CT to better define the anatomy of the patient's interatrial septal defect and pulmonary veins. Will refer the patient to cardiac surgery.  Contrast: none Complications: no immediate complications  [name] 06/27/2013, 11:56 AM

## 2013-06-27 NOTE — H&P (View-Only) (Signed)
HPI:   38 year old gentleman presenting for evaluation of atrial septal defect. The patient has been told heart murmur since he was a child. He is originally from [place]. When he was in his early 55s he began to notice marked shortness of breath with activity. He was diagnosed with a large secundum ASD and he underwent open surgical repair with a pericardial patch. The operative note describes a 4 cm diameter atrial septal defect at that time. He then did well for many years until the last 6-12 months when he has developed progressive shortness of breath with activity. He was hospitalized in December 2014 with chest pain and shortness of breath. An echocardiogram demonstrated an enlarged right heart with left to right flow across the interatrial septum. A TEE was done and this confirmed a large secundum ASD at the site of previous surgical repair.  The patient is interviewed today with an interpreter. He describes progressive shortness of breath now with minimal activity. He also has frequent palpitations and cough. He describes lightheadedness but has had no episodes of syncope. He denies chest pain or pressure with exertion. He can only walk short distances without significant dyspnea. He denies orthopnea, PND, or leg swelling.  The patient has previously worked in [employer]. He has 5 children, ages ranging from 25 years old to 40 years old. He has not smoked cigarettes or drink alcohol in well over a decade. He was a heavy alcohol drinker in his early 65s. There is no family history of heart disease, either congenital or atherosclerotic coronary disease. Other medical comorbidities include type 2 diabetes.  Outpatient Encounter Prescriptions as of 06/16/2013  Medication Sig  . aspirin EC 81 MG EC tablet Take 1 tablet (81 mg total) by mouth daily.  . metFORMIN (GLUCOPHAGE) 500 MG tablet Take 1 tablet (500 mg total) by mouth 2 (two) times daily with a meal.  . nitroGLYCERIN (NITROSTAT) 0.4 MG SL  tablet Place 1 tablet (0.4 mg total) under the tongue every 5 (five) minutes x 3 doses as needed for chest pain.  Marland Kitchen PARoxetine (PAXIL) 20 MG tablet Take 1 tablet (20 mg total) by mouth daily.    Review of patient's allergies indicates no known allergies.  Past Medical History  Diagnosis Date  . Type 2 diabetes mellitus   . Atrial septal defect     Past Surgical History  Procedure Laterality Date  . Possible asd or vsd repair      [facility] - 15 years ago  . Liver surgery    . Back surgery  2011  . Appendectomy  2012  . Tee without cardioversion N/A 05/26/2013    Procedure: TRANSESOPHAGEAL ECHOCARDIOGRAM (TEE);  Surgeon: Antoine Poche, MD;  Location: [facility];  Service: Cardiology;  Laterality: N/A;    History   Social History  . Marital Status: Single    Spouse Name: N/A    Number of Children: N/A  . Years of Education: N/A   Occupational History  . Not on file.   Social History Main Topics  . Smoking status: Never Smoker   . Smokeless tobacco: Not on file  . Alcohol Use: No     Comment: History of alcohol abuse  . Drug Use: No  . Sexual Activity: Not on file   Other Topics Concern  . Not on file   Social History Narrative  . No narrative on file    Family History  Problem Relation Age of Onset  . Diabetes Mellitus II  ROS:  General: no fevers/chills/night sweats Eyes: no blurry vision, diplopia, or amaurosis ENT: no sore throat or hearing loss Resp: no  wheezing, or hemoptysis, otherwise see history of present illness CV: See history of present illness GI: no abdominal pain, nausea, vomiting, diarrhea, or constipation GU: no dysuria, frequency, or hematuria Skin: no rash Neuro: no headache, numbness, tingling, or weakness of extremities Musculoskeletal: no joint pain or swelling Heme: no bleeding, DVT, or easy bruising Endo: no polydipsia or polyuria  BP 120/80  Pulse 72  Ht 5\' 5"  (1.651 m)  Wt 172 lb (78.019 kg)  BMI 28.62 kg/m2  SpO2  97%  PHYSICAL EXAM: Pt is alert and oriented, WD, WN, in no distress. HEENT: normal Neck: JVP normal. Carotid upstrokes normal without bruits. No thyromegaly. Lungs: equal expansion, clear bilaterally CV: Apex is discrete and nondisplaced, RRR with a grade 2/6 systolic murmur at the left sternal border and a widely split S2  Abd: soft, NT, +BS, no bruit, no hepatosplenomegaly Back: no CVA tenderness Ext: no C/C/E        DP/PT pulses intact and = Skin: warm and dry without rash Neuro: CNII-XII intact             Strength intact = bilaterally  2-D echocardiogram 03/28/2013:  ------------------------------------------------------------ Study Conclusions  - Left ventricle: The cavity size was normal. Wall thickness was increased in a pattern of mild LVH. Systolic function was normal. The estimated ejection fraction was in the range of 60% to 65%. Wall motion was normal; there were no regional wall motion abnormalities. No evidence of VSD. Features are consistent with a pseudonormal left ventricular filling pattern, with concomitant abnormal relaxation and increased filling pressure (grade 2 diastolic dysfunction). - Ventricular septum: The contour showed diastolic flattening and systolic flattening consistent with increased RV pressure and volume. - Mitral valve: Trivial regurgitation. - Left atrium: The atrium was mildly dilated. - Right ventricle: The cavity size was severely dilated. - Right atrium: The atrium was severely dilated. - Atrial septum: No definite evidence of repair. Suggestive of secundum ASD or PFO. The septum bowed from left to right, consistent with increased left atrial pressure. There was a left-to-right shunt. - Tricuspid valve: Dilated annulus with mild prolapse of the anterior leaflet. Moderate regurgitation. - Pulmonary arteries: PA peak pressure: 42mm Hg (S). - Pericardium, extracardiac: There was no pericardial effusion. Impressions:  - No prior  study available for comparison. Mild LVH with LVEF 60-65%, grade 2 diastolic dysfunction. Mild left atrial enlargement. History indicates prior repair of "hole in heart." There is evidence of RV pressure and volume overload with severe RV enlargement as well. Possible secundum ASD or PFO without definitive evidence of repair. There is a left to right shunt by color Doppler. Coronary sinus appears dilated. No VSD visualized. Severe right atrial enlargement. Moderate tricuspid regurgitation with PASP 42 mmHg. Suggest obtaining prior operative report to further clarify.  Transesophageal echocardiogram 05/26/2013: Study Conclusions  - Study data: Technically adequate study. - Left ventricle: The cavity size was normal. Wall thickness was normal. Systolic function was normal. The estimated ejection fraction was in the range of 55% to 60%. Doppler parameters are consistent with abnormal left ventricular relaxation (grade 1 diastolic dysfunction). - Left atrium: No evidence of thrombus in the atrial cavity or appendage. - Right ventricle: The cavity size was severely dilated. Systolic function was moderately reduced. - Right atrium: The atrium was severely dilated. - Atrial septum: There is a secundum ASD with significant left to right  shunt by color Doppler. The width of the defect is approximately 1.6 cm. Technically unable to obtain RVOT and LVOT Doppler images, not able to calculate Qp/Qs. - Tricuspid valve: Moderate-severe regurgitation. The regurgitation appears to be secondary to poor leaflet coaptation due to severe tricuspid anular dilatation.  ASSESSMENT AND PLAN: Secundum ASD. This is a 38 year old gentleman with a large secundum ASD and evidence of RV volume overload on echocardiography. I have reviewed his surface echocardiogram and his TEE images in detail. His ASD measures about 16 mm in diameter by TEE. He appears to have sufficient tissue rims for consideration of  transcatheter closure. He also appears to have a right atrial membrane without evidence of turbulent flow around this. He clearly has significant hemodynamic impact of his atrial septal defect. It appears that part of the surgical patch has become deficient. Will further interrogate the right atrial membrane with intracardiac echo and hemodynamic study at the time of his right heart catheterization. I don't anticipate that this will interfere with device closure of his ASD.  I have reviewed all considerations around transcatheter ASD closure with the patient. I have discussed specific risks, including vascular injury, bleeding, stroke, myocardial infarction, cardiac perforation, cardiac tamponade, emergency cardiac surgery, device embolization, endocarditis, and death. The patient understands these risks are low and occur at a frequency of approximately 1%. He will undergo right heart catheterization and transcatheter ASD closure under guidance of intracardiac echocardiography. The procedure is tentatively scheduled for March 3. He will be started on Plavix 75 mg daily. All of his questions have been answered.  [name] 06/16/2013 6:10 PM

## 2013-06-28 ENCOUNTER — Other Ambulatory Visit: Payer: Self-pay | Admitting: Cardiovascular Disease

## 2013-06-29 ENCOUNTER — Other Ambulatory Visit: Payer: Self-pay | Admitting: Physician Assistant

## 2013-06-29 DIAGNOSIS — Q211 Atrial septal defect, unspecified: Secondary | ICD-10-CM

## 2013-06-30 ENCOUNTER — Ambulatory Visit (HOSPITAL_COMMUNITY)
Admission: RE | Admit: 2013-06-30 | Discharge: 2013-06-30 | Disposition: A | Discharge status: Home / Self Care | Payer: Self-pay | Source: Ambulatory Visit | Attending: Cardiovascular Disease | Admitting: Cardiovascular Disease

## 2013-06-30 DIAGNOSIS — Q2111 Secundum atrial septal defect: Secondary | ICD-10-CM | POA: Insufficient documentation

## 2013-06-30 DIAGNOSIS — Q211 Atrial septal defect: Secondary | ICD-10-CM

## 2013-06-30 DIAGNOSIS — I517 Cardiomegaly: Secondary | ICD-10-CM | POA: Insufficient documentation

## 2013-06-30 DIAGNOSIS — I509 Heart failure, unspecified: Secondary | ICD-10-CM | POA: Insufficient documentation

## 2013-06-30 MED ORDER — IOHEXOL 350 MG/ML SOLN
100.0000 mL | Freq: Once | INTRAVENOUS | Status: AC | PRN
  Administered 2013-06-30: 100 mL via INTRAVENOUS

## 2013-06-30 MED ORDER — METOPROLOL TARTRATE 1 MG/ML IV SOLN
2.5000 mg | Freq: Once | INTRAVENOUS | Status: AC
  Administered 2013-06-30: 2.5 mg via INTRAVENOUS

## 2013-06-30 MED ORDER — METOPROLOL TARTRATE 1 MG/ML IV SOLN
INTRAVENOUS | Status: AC
  Filled 2013-06-30: qty 5

## 2013-06-30 MED ORDER — NITROGLYCERIN 0.4 MG SL SUBL
SUBLINGUAL_TABLET | SUBLINGUAL | Status: AC
  Filled 2013-06-30: qty 1

## 2013-07-07 ENCOUNTER — Encounter: Payer: Self-pay | Admitting: Thoracic Surgery (Cardiothoracic Vascular Surgery)

## 2013-07-07 ENCOUNTER — Institutional Professional Consult (permissible substitution) (INDEPENDENT_AMBULATORY_CARE_PROVIDER_SITE_OTHER): Payer: Self-pay | Admitting: Thoracic Surgery (Cardiothoracic Vascular Surgery)

## 2013-07-07 ENCOUNTER — Other Ambulatory Visit: Payer: Self-pay | Admitting: *Deleted

## 2013-07-07 ENCOUNTER — Other Ambulatory Visit: Payer: Self-pay | Admitting: Thoracic Surgery (Cardiothoracic Vascular Surgery)

## 2013-07-07 VITALS — BP 113/72 | HR 60 | Resp 20 | Ht 65.0 in | Wt 176.0 lb

## 2013-07-07 DIAGNOSIS — I519 Heart disease, unspecified: Secondary | ICD-10-CM | POA: Insufficient documentation

## 2013-07-07 DIAGNOSIS — Q2111 Secundum atrial septal defect: Secondary | ICD-10-CM

## 2013-07-07 DIAGNOSIS — I079 Rheumatic tricuspid valve disease, unspecified: Secondary | ICD-10-CM

## 2013-07-07 DIAGNOSIS — I50812 Chronic right heart failure: Secondary | ICD-10-CM | POA: Insufficient documentation

## 2013-07-07 DIAGNOSIS — Q211 Atrial septal defect, unspecified: Secondary | ICD-10-CM

## 2013-07-07 DIAGNOSIS — I071 Rheumatic tricuspid insufficiency: Secondary | ICD-10-CM

## 2013-07-07 DIAGNOSIS — Q263 Partial anomalous pulmonary venous connection: Secondary | ICD-10-CM | POA: Insufficient documentation

## 2013-07-07 DIAGNOSIS — I7409 Other arterial embolism and thrombosis of abdominal aorta: Secondary | ICD-10-CM

## 2013-07-07 LAB — COMPREHENSIVE METABOLIC PANEL
ALBUMIN: 4.1 g/dL (ref 3.5–5.2)
ALT: 31 U/L (ref 0–53)
AST: 25 U/L (ref 0–37)
Alkaline Phosphatase: 114 U/L (ref 39–117)
BUN: 11 mg/dL (ref 6–23)
CHLORIDE: 97 meq/L (ref 96–112)
CO2: 28 meq/L (ref 19–32)
CREATININE: 0.7 mg/dL (ref 0.50–1.35)
Calcium: 9 mg/dL (ref 8.4–10.5)
Glucose, Bld: 263 mg/dL — ABNORMAL HIGH (ref 70–99)
Potassium: 4.2 mEq/L (ref 3.5–5.3)
Sodium: 133 mEq/L — ABNORMAL LOW (ref 135–145)
Total Bilirubin: 0.8 mg/dL (ref 0.2–1.2)
Total Protein: 7 g/dL (ref 6.0–8.3)

## 2013-07-07 MED ORDER — AMIODARONE HCL 200 MG PO TABS: 200.0000 mg | ORAL_TABLET | Freq: Two times a day (BID) | ORAL | Status: DC

## 2013-07-07 NOTE — H&P (Signed)
[address]       [place] [address]             [phone]     CARDIOTHORACIC SURGERY CONSULTATION REPORT  Referring Provider is [name], MD PCP is [name], MD  Chief Complaint  Patient presents with  . Shortness of Breath    Surgical eval for large recurrent/persistent atrial septal defect, Cardiac Cath 06/27/13, Cardiac CT 06/30/13    HPI:  Patient is a 38 year old Hispanic male with history of congenital heart disease who underwent pericardial patch repair of what was reported to be a secundum type atrial septal defect by Dr. Buford Dresser at [facility] in [place] in 1999. The patient and his wife recall that he was initially discharged from the hospital within 4 or 5 days after surgery, but he was shortly thereafter readmitted with respiratory failure.  He was hospitalized for nearly 3 months during which time he underwent left thoracotomy for lung biopsy. He was told that he had pneumonia and significant liver dysfunction. 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  The patient states that he first began to experience worsening exertional shortness of breath over 6 months ago. He began to fatigue easily and ultimately lost his job 4 months ago because he could not keep pace.  Symptoms continued to progress until the patient was eventually hospitalized at [facility] in December with shortness of breath and atypical chest pain.   An echocardiogram demonstrated an enlarged right heart with left to right flow across the interatrial septum. A TEE was done and this confirmed a large  ASD.  He was referred to Dr. Excell Seltzer took him to the cath lab for possible closure. However, the patient was found to have anatomical characteristics unfavorable for percutaneous closure.  A cardiac gated CT angiogram of the heart was performed to better characterize the anatomy and the patient has been referred for possible surgical  intervention.  The patient is evaluated in the office today with his wife and parents present with the assistance of an interpreter. The patient describes a continued progression of symptoms of exertional shortness of breath over the past 6 months. The patient now get short of breath with minimal physical activity and occasionally at rest. He occasionally wakes up in the middle of the night feeling dyspneic. He denies orthopnea or lower extremity edema. He has not had dizzy spells or syncope. He has mild occasional symptoms of sharp stabbing pains in the left anterior chest that seemed to correspond with episodes of worsening shortness of breath. He has frequent palpitations.  He reports a chronic, dry, nonproductive cough. He denies any history of hemoptysis. He has not had fevers or chills.  He complains that he fatigues very easily.   Past Medical History  Diagnosis Date  . Type 2 diabetes mellitus   . Atrial septal defect   . Atrial septal defect, recurrent 06/16/2013  . Tricuspid regurgitation   . Diabetes type 2, uncontrolled 03/28/2013  . Atypical chest pain 03/27/2013  . Status post thoracotomy 12/14/1997    Left anterior thoracotomy for lung biopsy within 1 month of ASD closure @ [facility]   . Chronic right-sided congestive heart failure   . Right ventricular dysfunction   . S/P atrial septal defect closure 11/22/1997    Pericardial patch closure of reported secundum type ASD via median sternotomy by Dr Buford Dresser @ [facility]     Past Surgical History  Procedure Laterality Date  . Asd repair  11/22/1997    Dr Buford Dresser @ [facility]  . Back surgery  2011  . Appendectomy  2012  . Tee without cardioversion N/A 05/26/2013    Procedure: TRANSESOPHAGEAL ECHOCARDIOGRAM (TEE);  Surgeon: Antoine Poche, MD;  Location: [facility];  Service: Cardiology;  Laterality: N/A;  . Thoracotomy Left 12/14/1997    Lung biopsy @ [facility]    Family History  Problem Relation Age of Onset  . Diabetes Mellitus II       History   Social History  . Marital Status: Single    Spouse Name: N/A    Number of Children: N/A  . Years of Education: N/A   Occupational History  . Not on file.   Social History Main Topics  . Smoking status: Never Smoker   . Smokeless tobacco: Not on file  . Alcohol Use: No     Comment: History of alcohol abuse  . Drug Use: No  . Sexual Activity: Not on file   Other Topics Concern  . Not on file   Social History Narrative  . No narrative on file    Current Outpatient Prescriptions  Medication Sig Dispense Refill  . aspirin EC 81 MG EC tablet Take 1 tablet (81 mg total) by mouth daily.  30 tablet  12  . metFORMIN (GLUCOPHAGE) 1000 MG tablet Take 1 tablet (1,000 mg total) by mouth 2 (two) times daily with a meal.  60 tablet  8  . nitroGLYCERIN (NITROSTAT) 0.4 MG SL tablet Place 1 tablet (0.4 mg total) under the tongue every 5 (five) minutes x 3 doses as needed for chest pain.  25 tablet  4  . amiodarone (PACERONE) 200 MG tablet Take 1 tablet (200 mg total) by mouth 2 (two) times daily. Begin 7 days prior to surgery.  20 tablet  0   No current facility-administered medications for this visit.    No Known Allergies    Review of Systems:   General:  normal appetite, decreased energy, no weight gain, no weight loss, no fever  Cardiac:  no chest pain with exertion, occasional chest pain at rest, + SOB with mild exertion, occasional resting SOB, + PND, no orthopnea, + palpitations, no arrhythmia, no atrial fibrillation, no LE edema, no dizzy spells, no syncope  Respiratory:  + shortness of breath, no home oxygen, no productive cough, + dry cough, no bronchitis, no wheezing, no hemoptysis, no asthma, no pain with inspiration or cough, + possible sleep apnea, no CPAP at night  GI:   no difficulty swallowing, no reflux, no frequent heartburn, no hiatal hernia, no abdominal pain, no constipation, no diarrhea, no hematochezia, no hematemesis, no melena  GU:   no dysuria,   no frequency, no urinary tract infection, no hematuria, no enlarged prostate, no kidney stones, no kidney disease  Vascular:  no pain suggestive of claudication, no pain in feet, no leg cramps, no varicose veins, no DVT, no non-healing foot ulcer  Neuro:   no stroke, no TIA's, no seizures, no headaches, no temporary blindness one eye,  no slurred speech, no peripheral neuropathy, no chronic pain, no instability of gait, no memory/cognitive dysfunction  Musculoskeletal: no arthritis, no joint swelling, no myalgias, no difficulty walking, no mobility   Skin:   no rash, no itching, no skin infections, no pressure sores or ulcerations  Psych:   no anxiety, no depression, no nervousness, + unusual recent stress  Eyes:   no blurry vision, no  floaters, no recent vision changes, no wears glasses or contacts  ENT:   no hearing loss, no loose or painful teeth, no dentures, last saw dentist cannot recall  Hematologic:  no easy bruising, no abnormal bleeding, no clotting disorder, no frequent epistaxis  Endocrine:  + diabetes, + checks CBG's at home     Physical Exam:   BP 113/72  Pulse 60  Resp 20  Ht 5\' 5"  (1.651 m)  Wt 176 lb (79.833 kg)  BMI 29.29 kg/m2  SpO2 97%  General:    well-appearing  HEENT:  Unremarkable   Neck:   no JVD, no bruits, no adenopathy   Chest:   clear to auscultation, symmetrical breath sounds, no wheezes, no rhonchi   CV:   RRR, soft systolic murmur, fixed split second heart sound  Abdomen:  soft, non-tender, no masses   Extremities:  warm, well-perfused, pulses diminished but palpable, no LE edema  Rectal/GU  Deferred  Neuro:   Grossly non-focal and symmetrical throughout  Skin:   Clean and dry, no rashes, no breakdown   Diagnostic Tests:  Transthoracic Echocardiography  Patient:    Jaymie, Misch MR #:       [id number] Study Date: 03/28/2013 Gender:     M Age:        48 Height:     165.1cm Weight:     79.5kg BSA:        1.66m^2 Pt. Status: Room:        A336    Romana Juniper  ADMITTING    Vania Rea  PERFORMING   Delma Freeze Penn  SONOGRAPHER  Metro Kung, RDCS  ATTENDING    Rancour, Jeannett Senior cc:  ------------------------------------------------------------ LV EF: 60% -   65%  ------------------------------------------------------------ Indications:      Chest pain 786.51.  ------------------------------------------------------------ History:   Risk factors:  Diabetes mellitus.  ------------------------------------------------------------ Study Conclusions  - Left ventricle: The cavity size was normal. Wall thickness   was increased in a pattern of mild LVH. Systolic function   was normal. The estimated ejection fraction was in the   range of 60% to 65%. Wall motion was normal; there were no   regional wall motion abnormalities. No evidence of VSD.   Features are consistent with a pseudonormal left   ventricular filling pattern, with concomitant abnormal   relaxation and increased filling pressure (grade 2   diastolic dysfunction). - Ventricular septum: The contour showed diastolic   flattening and systolic flattening consistent with   increased RV pressure and volume. - Mitral valve: Trivial regurgitation. - Left atrium: The atrium was mildly dilated. - Right ventricle: The cavity size was severely dilated. - Right atrium: The atrium was severely dilated. - Atrial septum: No definite evidence of repair. Suggestive   of secundum ASD or PFO. The septum bowed from left to   right, consistent with increased left atrial pressure.   There was a left-to-right shunt. - Tricuspid valve: Dilated annulus with mild prolapse of the   anterior leaflet. Moderate regurgitation. - Pulmonary arteries: PA peak pressure: 42mm Hg (S). - Pericardium, extracardiac: There was no pericardial   effusion. Impressions:  - No prior study available for comparison. Mild LVH with   LVEF  60-65%, grade 2 diastolic dysfunction. Mild left   atrial enlargement. History indicates prior repair of   "hole in heart." There is evidence of RV pressure and   volume overload with severe RV enlargement  as well.   Possible secundum ASD or PFO without definitive evidence   of repair. There is a left to right shunt by color   Doppler. Coronary sinus appears dilated. No VSD   visualized. Severe right atrial enlargement. Moderate   tricuspid regurgitation with PASP 42 mmHg. Suggest   obtaining prior operative report to further clarify.  ------------------------------------------------------------ Labs, prior tests, procedures, and surgery: Possible ASD or VSD repair 15 years ago Transthoracic echocardiography.  M-mode, complete 2D, spectral Doppler, and color Doppler.  Height:  Height: 165.1cm. Height: 65in.  Weight:  Weight: 79.5kg. Weight: 174.9lb.  Body mass index:  BMI: 29.2kg/m^2.  Body surface area:    BSA: 1.20m^2.  Blood pressure:     99/46.  Patient status:  Inpatient.  Location:  Bedside.  ------------------------------------------------------------  ------------------------------------------------------------ Left ventricle:  The cavity size was normal. Wall thickness was increased in a pattern of mild LVH. Systolic function was normal. The estimated ejection fraction was in the range of 60% to 65%. Wall motion was normal; there were no regional wall motion abnormalities. No evidence of VSD. Features are consistent with a pseudonormal left ventricular filling pattern, with concomitant abnormal relaxation and increased filling pressure (grade 2 diastolic dysfunction).   ------------------------------------------------------------ Aortic valve:   Trileaflet. Cusp separation was normal. Doppler:   No significant regurgitation.    VTI ratio of LVOT to aortic valve: 0.57. Peak velocity ratio of LVOT to aortic valve: 0.81.    Mean gradient: 5mm Hg  (S).  ------------------------------------------------------------ Aorta:  Aortic root: The aortic root was normal in size.  ------------------------------------------------------------ Mitral valve:   The valve appears to be grossly normal. Doppler:   Trivial regurgitation.  ------------------------------------------------------------ Left atrium:  The atrium was mildly dilated.  ------------------------------------------------------------ Atrial septum:  No definite evidence of repair. Suggestive of secundum ASD or PFO. The septum bowed from left to right, consistent with increased left atrial pressure. There was a left-to-right shunt.  ------------------------------------------------------------ Right ventricle:  The cavity size was severely dilated. Systolic function was normal.  ------------------------------------------------------------ Ventricular septum:   The contour showed diastolic flattening and systolic flattening consistent with increased RV pressure and volume.  ------------------------------------------------------------ Pulmonic valve:    The valve appears to be grossly normal.  Doppler:   Trivial regurgitation.  ------------------------------------------------------------ Tricuspid valve:  Dilated annulus with mild prolapse of the anterior leaflet.  Doppler:   Moderate regurgitation.  ------------------------------------------------------------ Right atrium:  The atrium was severely dilated.  ------------------------------------------------------------ Pericardium:  There was no pericardial effusion.  ------------------------------------------------------------ Systemic veins: Inferior vena cava: The vessel was dilated; the respirophasic diameter changes were in the normal range (= 50%).  ------------------------------------------------------------  2D measurements        Normal  Doppler measurements   Normal Left ventricle                 Main  pulmonary LVID ED,   44.5 mm     43-52   artery chord,                         Pressure, S    42 mm   =30 PLAX                                             Hg LVID ES,   27.9 mm     23-38   Left ventricle chord,  Ea, lat ann,  8.9 cm/s ------ PLAX                           tiss DP FS, chord,   37 %      >29     E/Ea, lat     7.8      ------ PLAX                           ann, tiss DP    8 LVPW, ED   12.2 mm     ------  Ea, med ann,  7.9 cm/s ------ IVS/LVPW   1.11        <1.3    tiss DP ratio, ED                      E/Ea, med     8.8      ------ Ventricular septum             ann, tiss DP    7 IVS, ED    13.6 mm     ------  LVOT Aorta                          Peak vel, S   79. cm/s ------ Root diam,   32 mm     ------                  8 ED                             VTI, S         14 cm   ------ Left atrium                    Aortic valve AP dim       55 mm     ------  Peak vel, S   99. cm/s ------ AP dim     2.94 cm/m^2 <2.2                    1 index                          Mean vel, S   106 cm/s ------ Right ventricle                VTI, S        24. cm   ------ RVID ED,   48.7 mm     19-38                   5 PLAX                           Mean            5 mm   ------                                gradient, S       Hg                                VTI  ratio     0.5      ------                                LVOT/AV         7                                Peak vel      0.8      ------                                ratio,          1                                LVOT/AV                                Mitral valve                                Peak E vel    70. cm/s ------                                                1                                Peak A vel    23. cm/s ------                                                8                                Deceleration  215 ms   150-23                                time                   0                                 Peak E/A      2.9      ------                                ratio                                Tricuspid valve  Regurg peak   293 cm/s ------                                vel                                Peak RV-RA     34 mm   ------                                gradient, S       Hg                                Max regurg    293 cm/s ------                                vel                                Systemic veins                                Estimated CVP   8 mm   ------                                                  Hg                                Right ventricle                                Pressure, S    42 mm   <30                                                  Hg                                Sa vel, lat   14. cm/s ------                                ann, tiss DP    1   ------------------------------------------------------------ Prepared and Electronically Authenticated by  Nona Dell 2014-12-02T16:50:19.243    Transesophageal Echocardiography  Patient:    Vernard, Gram MR #:       [id number] Study Date: 05/26/2013 Gender:     M Age:        37 Height:     165.1cm Weight:     79.4kg BSA:        1.59m^2 Pt. Status: Room:       APPO  SONOGRAPHER  Metro Kung, RDCS  ADMITTING    Patrick Jupiter, M.D.  ATTENDING    Patrick Jupiter, M.D.  Lisette Abu, M.D.  PERFORMING   [facility] [place] cc:  ------------------------------------------------------------ LV EF: 55% -   60%  ------------------------------------------------------------ Indications:      Atrial Septal Defect 745.5.  ------------------------------------------------------------ History:   PMH:   Chest pain.  Risk factors:  ASD/VSD repair [facility] years ago at [facility] Diabetes mellitus.  ------------------------------------------------------------ Study Conclusions  - Study data: Technically  adequate study. - Left ventricle: The cavity size was normal. Wall thickness   was normal. Systolic function was normal. The estimated   ejection fraction was in the range of 55% to 60%. Doppler   parameters are consistent with abnormal left ventricular   relaxation (grade 1 diastolic dysfunction). - Left atrium: No evidence of thrombus in the atrial cavity   or appendage. - Right ventricle: The cavity size was severely dilated.   Systolic function was moderately reduced. - Right atrium: The atrium was severely dilated. - Atrial septum: There is a secundum ASD with significant   left to right shunt by color Doppler. The width of the   defect is approximately 1.6 cm. Technically unable to   obtain RVOT and LVOT Doppler images, not able to calculate   Qp/Qs. - Tricuspid valve: Moderate-severe regurgitation. The   regurgitation appears to be secondary to poor leaflet   coaptation due to severe tricuspid anular dilatation. Transesophageal echocardiography.  2D and color Doppler. Height:  Height: 165.1cm. Height: 65in.  Weight:  Weight: 79.4kg. Weight: 174.6lb.  Body mass index:  BMI: 29.1kg/m^2.  Body surface area:    BSA: 1.38m^2.  Blood pressure: 111/83.  Patient status:  Outpatient.  Location:  Endoscopy.   ------------------------------------------------------------  ------------------------------------------------------------ Left ventricle:  The cavity size was normal. Wall thickness was normal. Systolic function was normal. The estimated ejection fraction was in the range of 55% to 60%. Doppler parameters are consistent with abnormal left ventricular relaxation (grade 1 diastolic dysfunction).  ------------------------------------------------------------ Aortic valve:   Trileaflet; normal thickness leaflets. There appears to be a Lambls excrescence attached to the left coronary cusp.  Doppler:   There was no stenosis.    No significant  regurgitation.  ------------------------------------------------------------ Aorta:  There is no significant plaque/calcification of the visualized portions of the descending aorta. Aortic root: The aortic root was normal in size.  ------------------------------------------------------------ Mitral valve:   Normal thickness leaflets .  Doppler: There was no evidence for stenosis.    No significant regurgitation.  ------------------------------------------------------------ Left atrium:  The atrium was normal in size.  No evidence of thrombus in the atrial cavity or appendage. The appendage was of normal size. Emptying velocity was normal.  ------------------------------------------------------------ Atrial septum:  There is a secundum ASD with significant left to right shunt by color Doppler. The width of the defect is approximately 1.6 cm. Technically unable to obtain RVOT and LVOT Doppler images, not able to calculate Qp/Qs.   ------------------------------------------------------------ Right ventricle:  The cavity size was severely dilated. Systolic function was moderately reduced.  ------------------------------------------------------------ Pulmonic valve:   Not well visualized.  Doppler:   There was no evidence for stenosis.    No significant regurgitation.   ------------------------------------------------------------ Tricuspid valve:   Doppler:   There was no evidence for stenosis.    Moderate-severe regurgitation. The TR vena contracta is 0.4 cm. The regurgitation appears to be secondary to poor leaflet coaptation due to severe tricuspid anular dilatation.  ------------------------------------------------------------  Right atrium:  The atrium was severely dilated.   ------------------------------------------------------------ Post procedure conclusions Ascending Aorta:  - There is no significant plaque/calcification of the   visualized portions of the descending  aorta.  ------------------------------------------------------------  2D measurements     Normal     Doppler measurements   Normal LVOT                           Mitral valve Diam, S     21 mm   ------     Peak E vel    48. cm/s ------ Area      3.46 cm^2 ------                     4 Aorta                          Peak A vel    85. cm/s ------ Root diam   30 mm   ------                     1                                Deceleration  180 ms   150-23                                time                   0                                Peak E/A      0.6      ------                                ratio   ------------------------------------------------------------ Prepared and Electronically Authenticated by  Patrick Jupiter, M.D. 2015-01-30T17:37:04.720    Cardiac Catheterization Procedure Note  Name: Camarion Weier MRN: [id number] DOB: [birth year]  Procedure: Right Heart Catheterization, Intracardiac echocardiography  Indication: Atrial Septal Defect. This is a 38 year old Hispanic gentleman who underwent surgical repair of a large ASD approximately 20 years ago at [facility]. He presented with progressive shortness of breath and echocardiography demonstrated an enlarged right heart. The patient underwent a transesophageal echo demonstrating recurrence of his ASD. The defect measured approximately 15 mm in diameter and appeared to be in the midportion of the interatrial septum by TEE. He presents today for planned transcatheter closure of this defect.         Procedural details: The right groin was prepped, draped, and anesthetized with 1% lidocaine. Using modified Seldinger technique, an 8 French sheath was introduced into the right femoral vein and a 9 French sheath was introduced into the right femoral vein just distal to the original sheath. A Swan-Ganz catheter was used to draw oxygen saturations and record right heart pressures. An intracardiac echo probe was  advanced into the right atrium and imaging was performed. The patient was given weight-based unfractionated heparin.  Intracardiac echo imaging demonstrated a deficient posterior rim of the ASD, functionally making this a primum ASD. Extensive imaging was performed. The defect  was not suitable for transcatheter closure because of this deficient posterior rim. The intracardiac echo probe and Swan-Ganz catheter were removed and the patient was transferred to the recovery room in stable condition. His sheaths will be pulled once his ACT is less than 175 and manual pressure will be used for hemostasis.  Procedural Findings: Hemodynamics:  Hemodynamics: RA 6 RV 37/9 PA 33/11 with a mean of 21 Pulmonary capillary wedge pressure 10  Pulmonary artery saturation 87% Pulmonary venous saturation 96%              Coronary angiography: Coronary dominance: right  Final Conclusions:   Interatrial septal defect, not suitable for transcatheter closure because of deficient posterior rim  Recommendations: Will obtain a gated cardiac CT to better define the anatomy of the patient's interatrial septal defect and pulmonary veins. Will refer the patient to cardiac surgery.  Contrast: none Complications: no immediate complications  [name] 06/27/2013, 11:56 AM    Cardiac/Coronary  CT   TECHNIQUE:   The patient was scanned on a Philips 256 scanner.   FINDINGS:   A 120 kV prospective scan was triggered in the ascending thoracic   aorta at 111 HU's. Axial non-contrast 3mm slices were carried out   through the heart. The data set was analyzed on a dedicated work   station and scored using the Agatson method. Gantry rotation speed   was 270 msecs and collimation was .9 mm. 2.5 mg of iv Metoprolol and   0.4 mg of sl NTG was given. The 3D data set was reconstructed in 5%   intervals of the 67-82 % of the R-R cycle. Diastolic phases were   analyzed on a dedicated work station using MPR,  MIP and VRT modes.   The patient received 100 cc of contrast. Heart rate at the time of acquisition was 56/minute.   1. Coronary Arteries:   Coronary arteries originate in the normal position. There is left dominance.   Left main gives rise to LAD and left circumflex artery.   Left main is a large caliber vessel with no plague.   LAD is a large caliber vessel that has no CAD. It gives rise to 1 diagonal branch without any plague.   LCX artery is a large dominant vessel that gives rise to an obtuse marginal branch, PDA and a very small PLVB. There is no evidence of plague.   RCA is a small non-dominant vessel that supply few acute marginal branches to the severely enlarged and hypertrophied right ventricle. There is no plague.   2. There is a large inferior sinus venosus type ASD located in the inferior portion of the interatrial septum with direct continuation into left lateral wall of the most superior portion of IVC (IVC overring interatrial septum). As a consequence there is very thin and short inferior rim.   The diameters are as follows: Antero-posterior 22 mm, superio-inferior 21 mm.   Rims measurements:   Anterior:  14 mm   Posterior: 12 mm, minimum 6 mm at the most inferior portion   Superior: 19 mm   Inferior:  5 mm   There is possible partial anomalous pulmonary venous return with RUPV (right upper pulmonary vein) draining into the SVC. The remaining 4 pulmonary veins (RMPV, PLPV, LUPV and LLPV) drain in a normal position into the left atrium.   3. Dilated main pulmonary artery measuring 43 x 36 mm. Dilated right pulmonary artery measuring 28 x 28 mm, left pulmonary artery measuring 31 x 30  mm. Increased pulmonary vasculature consistent with pulmonary hypertension.   4. Severe dilatation of the right ventricle with mild RVH and hypertrabeculation. Severely dilated right atrium.   Dilated IVC measuring 39 x 28 mm.   Intact coronary sinus.   3.   Mild left ventricular hypertrophy, mildly dilated left atrium.   IMPRESSION:   1. Coronary calcium score of 0. This was 0 percentile for age and sex matched control. Normal origin of coronary arteries, no evidence of CAD, left dominance.   2. 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 Dilated IVC overriding interatrial septum. 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   3. Dilated pulmonary arteries.   4. Severe right ventricular dilatation with right ventricular hypertrophy. Severely dilated right atrium.   5. Mild left ventricular hypertrophy, mildly dilated left atrium.   Tobias Alexander     Electronically Signed   By: Tobias Alexander   On: 07/02/2013 11:47       Study Result    EXAM: OVER-READ INTERPRETATION  CT CHEST   The following report is an over-read performed by radiologist Dr. Royal [facility] on 06/30/2013. This over-read does not include interpretation of cardiac or coronary anatomy or pathology. The interpretation by the cardiologist is attached.   COMPARISON:  No priors.   FINDINGS: Architectural distortion and in the anterior aspects of the upper lobes of the lungs bilaterally, and the right middle lobe, most compatible with chronic scarring (likely related to prior surgery). This is rather mild. Within the visualized portions of the thorax there is no acute consolidative airspace disease, no suspicious appearing pulmonary nodule or mass, and no pleural effusion. Several small pleural calcifications in the anterior aspect of the left hemithorax are noted. Visualized portions of the upper abdomen are remarkable for calcifications in the spleen, presumably granulomas. No aggressive appearing lytic or blastic lesions are noted within the visualized portions of the skeleton. Sternotomy wires.    IMPRESSION: 1. Mild postoperative scarring in the paramediastinal aspect of the lungs bilaterally. Otherwise, no significant noncardiac findings noted.   Electronically Signed: By: Trudie Reed M.D. 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  I suspect the patient originally had a sinus venosus-type atrial septal defect that was missed at the time of his original surgery.  Interestingly, the patient suffered from respiratory failure after his initial operation 15 years ago.  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  There is moderate to severe right ventricular chamber enlargement with moderate to severe tricuspid regurgitation.  Pulmonary artery pressures are only very mildly elevated and the shunt across the atrial septal defect remains entirely left to right.   Plan:  With the assistance of an interpreter the situation was explained at length with the patient and his entire family in the office today. The need for redo median sternotomy for redo closure of the atrial septal defect with possible repair of partial anomalous pulmonary venous return and tricuspid valve repair were discussed.  Risks associated with surgical intervention have been discussed at length.  Long-term prognosis with  continued medical therapy without surgery was discussed at length. The possibility of referral to a tertiary care center where adult congenital heart surgery is performed more commonly has also been offered as an alternative.  We plan to proceed with surgery on Thursday, 07/20/2013.  The patient and his family understand and accept all  potential risks of surgery including but not limited to risk of death, stroke or other neurologic complication, myocardial infarction, acute and/or chronic heart failure potentially requiring mechanical circulatory support, respiratory failure, renal failure, liver failure, bleeding requiring transfusion and/or reexploration, arrhythmia, heart block or bradycardia potentially requiring permanent pacemaker, infection or other wound complications, pneumonia, pleural and/or pericardial effusion, pulmonary embolus, aortic dissection or other major vascular complication, pulmonary venous obstruction, or delayed complications related to tricuspid valve repair or replacement including but not limited to structural valve deterioration and failure, thrombosis, embolization, endocarditis, or paravalvular leak.  All of their questions have been answered.  The patient has been instructed to stop taking aspirin. He has also been given a prescription for amiodarone to begin prior to surgery to decrease his risk of perioperative arrhythmias.   I spent in excess of 80 minutes during the conduct of this office consultation and >50% of this time involved direct face-to-face encounter with the patient for counseling and/or coordination of their care.   Salvatore Decent. Cornelius Moras, MD 07/07/2013 2:25 PM

## 2013-07-07 NOTE — Patient Instructions (Signed)
Stop taking aspirin now  Begin taking amiodarone 7 days before surgery

## 2013-07-10 ENCOUNTER — Other Ambulatory Visit: Payer: Self-pay | Admitting: *Deleted

## 2013-07-10 DIAGNOSIS — Q211 Atrial septal defect, unspecified: Secondary | ICD-10-CM

## 2013-07-13 ENCOUNTER — Ambulatory Visit (HOSPITAL_COMMUNITY)
Admission: RE | Admit: 2013-07-13 | Discharge: 2013-07-13 | Disposition: A | Discharge status: Home / Self Care | Payer: Self-pay | Source: Ambulatory Visit | Attending: Thoracic Surgery (Cardiothoracic Vascular Surgery) | Admitting: Thoracic Surgery (Cardiothoracic Vascular Surgery)

## 2013-07-13 DIAGNOSIS — Q211 Atrial septal defect, unspecified: Secondary | ICD-10-CM

## 2013-07-13 DIAGNOSIS — Z01811 Encounter for preprocedural respiratory examination: Secondary | ICD-10-CM | POA: Insufficient documentation

## 2013-07-13 DIAGNOSIS — Z01818 Encounter for other preprocedural examination: Secondary | ICD-10-CM | POA: Insufficient documentation

## 2013-07-13 DIAGNOSIS — I7409 Other arterial embolism and thrombosis of abdominal aorta: Secondary | ICD-10-CM

## 2013-07-13 DIAGNOSIS — Q2111 Secundum atrial septal defect: Secondary | ICD-10-CM | POA: Insufficient documentation

## 2013-07-13 LAB — PULMONARY FUNCTION TEST
DL/VA % PRED: 186 %
DL/VA: 8.21 ml/min/mmHg/L
DLCO UNC % PRED: 191 %
DLCO UNC: 51.63 ml/min/mmHg
FEF 25-75 Pre: 3.92 L/sec
FEF2575-%Pred-Pre: 107 %
FEV1-%Pred-Pre: 96 %
FEV1-Pre: 3.59 L
FEV1FVC-%PRED-PRE: 102 %
FEV6-%PRED-PRE: 96 %
FEV6-PRE: 4.38 L
FEV6FVC-%Pred-Pre: 101 %
FVC-%Pred-Pre: 94 %
FVC-Pre: 4.39 L
PRE FEV1/FVC RATIO: 82 %
PRE FEV6/FVC RATIO: 100 %
RV % pred: 116 %
RV: 1.86 L
TLC % PRED: 101 %
TLC: 6.19 L

## 2013-07-13 MED ORDER — IOHEXOL 350 MG/ML SOLN
100.0000 mL | Freq: Once | INTRAVENOUS | Status: AC | PRN
  Administered 2013-07-13: 100 mL via INTRAVENOUS

## 2013-07-17 ENCOUNTER — Encounter: Payer: Self-pay | Admitting: Thoracic Surgery (Cardiothoracic Vascular Surgery)

## 2013-07-17 ENCOUNTER — Ambulatory Visit (HOSPITAL_COMMUNITY)
Admission: RE | Admit: 2013-07-17 | Discharge: 2013-07-17 | Disposition: A | Discharge status: Home / Self Care | Payer: Self-pay | Source: Ambulatory Visit | Attending: Thoracic Surgery (Cardiothoracic Vascular Surgery) | Admitting: Thoracic Surgery (Cardiothoracic Vascular Surgery)

## 2013-07-17 ENCOUNTER — Encounter (HOSPITAL_COMMUNITY): Payer: Self-pay

## 2013-07-17 ENCOUNTER — Ambulatory Visit (INDEPENDENT_AMBULATORY_CARE_PROVIDER_SITE_OTHER): Payer: Self-pay | Admitting: Thoracic Surgery (Cardiothoracic Vascular Surgery)

## 2013-07-17 ENCOUNTER — Encounter (HOSPITAL_COMMUNITY)
Admission: RE | Admit: 2013-07-17 | Discharge: 2013-07-17 | Disposition: A | Discharge status: Home / Self Care | Payer: Self-pay | Source: Ambulatory Visit | Attending: Thoracic Surgery (Cardiothoracic Vascular Surgery) | Admitting: Thoracic Surgery (Cardiothoracic Vascular Surgery)

## 2013-07-17 VITALS — BP 124/72 | HR 71 | Temp 98.1°F | Resp 20 | Ht 65.0 in | Wt 174.9 lb

## 2013-07-17 VITALS — BP 110/69 | HR 68 | Resp 16 | Ht 65.0 in | Wt 176.0 lb

## 2013-07-17 DIAGNOSIS — Q211 Atrial septal defect, unspecified: Secondary | ICD-10-CM

## 2013-07-17 DIAGNOSIS — Q2111 Secundum atrial septal defect: Secondary | ICD-10-CM

## 2013-07-17 DIAGNOSIS — I071 Rheumatic tricuspid insufficiency: Secondary | ICD-10-CM

## 2013-07-17 DIAGNOSIS — IMO0001 Reserved for inherently not codable concepts without codable children: Secondary | ICD-10-CM | POA: Insufficient documentation

## 2013-07-17 DIAGNOSIS — Q263 Partial anomalous pulmonary venous connection: Secondary | ICD-10-CM

## 2013-07-17 DIAGNOSIS — IMO0002 Reserved for concepts with insufficient information to code with codable children: Secondary | ICD-10-CM

## 2013-07-17 DIAGNOSIS — E1165 Type 2 diabetes mellitus with hyperglycemia: Secondary | ICD-10-CM

## 2013-07-17 DIAGNOSIS — Z01810 Encounter for preprocedural cardiovascular examination: Secondary | ICD-10-CM

## 2013-07-17 DIAGNOSIS — I079 Rheumatic tricuspid valve disease, unspecified: Secondary | ICD-10-CM

## 2013-07-17 HISTORY — DX: Shortness of breath: R06.02

## 2013-07-17 LAB — BLOOD GAS, ARTERIAL
Acid-Base Excess: 0.2 mmol/L (ref 0.0–2.0)
Bicarbonate: 23.7 mEq/L (ref 20.0–24.0)
DRAWN BY: 344381
FIO2: 0.21 %
O2 Saturation: 97.5 %
PCO2 ART: 34.5 mmHg — AB (ref 35.0–45.0)
PO2 ART: 85.3 mmHg (ref 80.0–100.0)
Patient temperature: 98.6
TCO2: 24.8 mmol/L (ref 0–100)
pH, Arterial: 7.452 — ABNORMAL HIGH (ref 7.350–7.450)

## 2013-07-17 LAB — URINALYSIS, ROUTINE W REFLEX MICROSCOPIC
BILIRUBIN URINE: NEGATIVE
Glucose, UA: >1000 mg/dL — AB
Hgb urine dipstick: NEGATIVE
KETONES UR: 15 mg/dL — AB
Leukocytes, UA: NEGATIVE
Nitrite: NEGATIVE
PH: 6 (ref 5.0–8.0)
PROTEIN: 30 mg/dL — AB
Specific Gravity, Urine: 1.042 — ABNORMAL HIGH (ref 1.005–1.030)
Urobilinogen, UA: 0.2 mg/dL (ref 0.0–1.0)

## 2013-07-17 LAB — SURGICAL PCR SCREEN
MRSA, PCR: NEGATIVE
Staphylococcus aureus: POSITIVE — AB

## 2013-07-17 LAB — ABO/RH: ABO/RH(D): O POS

## 2013-07-17 LAB — COMPREHENSIVE METABOLIC PANEL
ALK PHOS: 140 U/L — AB (ref 39–117)
ALT: 44 U/L (ref 0–53)
AST: 39 U/L — AB (ref 0–37)
Albumin: 3.3 g/dL — ABNORMAL LOW (ref 3.5–5.2)
BILIRUBIN TOTAL: 0.4 mg/dL (ref 0.3–1.2)
BUN: 10 mg/dL (ref 6–23)
CHLORIDE: 98 meq/L (ref 96–112)
CO2: 18 meq/L — AB (ref 19–32)
Calcium: 8.6 mg/dL (ref 8.4–10.5)
Creatinine, Ser: 0.51 mg/dL (ref 0.50–1.35)
GLUCOSE: 307 mg/dL — AB (ref 70–99)
POTASSIUM: 4 meq/L (ref 3.7–5.3)
SODIUM: 134 meq/L — AB (ref 137–147)
Total Protein: 6.7 g/dL (ref 6.0–8.3)

## 2013-07-17 LAB — PROTIME-INR
INR: 1.33 (ref 0.00–1.49)
Prothrombin Time: 16.2 seconds — ABNORMAL HIGH (ref 11.6–15.2)

## 2013-07-17 LAB — CBC
HCT: 44.2 % (ref 39.0–52.0)
Hemoglobin: 16 g/dL (ref 13.0–17.0)
MCH: 33.5 pg (ref 26.0–34.0)
MCHC: 36.2 g/dL — ABNORMAL HIGH (ref 30.0–36.0)
MCV: 92.7 fL (ref 78.0–100.0)
PLATELETS: 141 10*3/uL — AB (ref 150–400)
RBC: 4.77 MIL/uL (ref 4.22–5.81)
RDW: 12.6 % (ref 11.5–15.5)
WBC: 5.8 10*3/uL (ref 4.0–10.5)

## 2013-07-17 LAB — URINE MICROSCOPIC-ADD ON

## 2013-07-17 LAB — APTT: aPTT: 28 seconds (ref 24–37)

## 2013-07-17 LAB — HEMOGLOBIN A1C
Hgb A1c MFr Bld: 11.7 % — ABNORMAL HIGH (ref <5.7)
Mean Plasma Glucose: 289 mg/dL — ABNORMAL HIGH (ref <117)

## 2013-07-17 MED ORDER — CHLORHEXIDINE GLUCONATE 4 % EX LIQD: 30.0000 mL | CUTANEOUS | Status: DC

## 2013-07-17 NOTE — Progress Notes (Signed)
VASCULAR LAB PRELIMINARY  PRELIMINARY  PRELIMINARY  PRELIMINARY  Pre-op Cardiac Surgery  Carotid Findings:  Bilateral:  1-39% ICA stenosis.  Lowest end of range.  Vertebral artery flow is antegrade.     Upper Extremity Right Left  Brachial Pressures 105 Triphasic 105 Triphasic  Radial Waveforms Triphasic Triphasic  Ulnar Waveforms Triphasic Triphasic  Palmar Arch (Allen's Test) Normal Normal   Findings:  Doppler waveforms remained normal bilaterally with both radial and ulnar compressions    Lower  Extremity Right Left  Dorsalis Pedis 116 Triphasic 110 Triphasic  Posterior Tibial 115 Triphasic 125 Triphasic  Ankle/Brachial Indices 1.10 1.19    Findings: ABIs and Doppler waveforms are normal bilaterally at rest    Kehinde Bowdish, RVS 07/17/2013, 1:07 PM

## 2013-07-17 NOTE — Progress Notes (Signed)
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  He was originally seen in consultation 07/07/2013. He returns to the office today with plans to proceed with elective surgery later this week. He reports no new problems or complaints over the last 10 days.    Current Outpatient Prescriptions  Medication Sig Dispense Refill  . amiodarone (PACERONE) 200 MG tablet Take 1 tablet (200 mg total) by mouth 2 (two) times daily. Begin 7 days prior to surgery.  20 tablet  0  . aspirin EC 81 MG EC tablet Take 1 tablet (81 mg total) by mouth daily.  30 tablet  12  . metFORMIN (GLUCOPHAGE) 1000 MG tablet Take 1 tablet (1,000 mg total) by mouth 2 (two) times daily with a meal.  60 tablet  8  . nitroGLYCERIN (NITROSTAT) 0.4 MG SL tablet Place 1 tablet (0.4 mg total) under the tongue every 5 (five) minutes x 3 doses as needed for chest pain.  25 tablet  4   No current facility-administered medications for this visit.   Facility-Administered Medications Ordered in Other Visits  Medication Dose Route Frequency Provider Last Rate Last Dose  . chlorhexidine (HIBICLENS) 4 % liquid 2 application  30 mL Topical UD Purcell Nailslarence H Jacyln Carmer, MD          Physical Exam:   BP 110/69  Pulse 68  Resp 16  Ht 5\' 5"  (1.651 m)  Wt 176 lb (79.833 kg)  BMI 29.29 kg/m2  SpO2 97%  General:  Well-appearing  Chest:   Clear to auscultation  CV:   Regular rate and rhythm with fixed split second heart sound  Incisions:  n/a  Abdomen:  Soft and nontender  Extremities:  Warm and well-perfused  Diagnostic Tests:  CHEST 2 VIEW  COMPARISON:  March 27, 2013.  FINDINGS:  Stable cardiomediastinal silhouette. Sternotomy wires are noted. No  pneumothorax or pleural effusion is noted. Stable scarring is seen  in right suprahilar region. Stable scarring and postoperative change  seen in lingular region. No acute pulmonary disease is noted. Bony  thorax is intact.  IMPRESSION:  No acute cardiopulmonary abnormality seen.  Electronically Signed  By: Roque LiasJames Green M.D.  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 I suspect the patient originally had a sinus venosus-type atrial septal defect that was missed at the time of his original surgery. Interestingly, the patient suffered from respiratory failure after his initial operation 15 years ago. 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 There is moderate to severe right ventricular chamber enlargement with moderate to severe tricuspid regurgitation. Pulmonary artery pressures are only very mildly elevated and the shunt across the atrial septal defect remains entirely left to right.   Plan:  We plan to proceed  with surgery later this week for redo median sternotomy for redo closure of atrial septal defect with repair of partial anomalous pulmonary venous return and possible tricuspid valve repair. The patient was again counseled regarding the indications, risks, and potential benefits of surgery. All of his questions been addressed.   Salvatore Decent. Cornelius Moras, MD 07/17/2013 4:47 PM

## 2013-07-17 NOTE — Pre-Procedure Instructions (Signed)
Terry HardyGerardo Craig  07/17/2013   Your procedure is scheduled on:  07/20/13  [name] * 3 at 530 AM.  Call this number if you have problems the morning of surgery: [phone]   Remember:   Do not eat food or drink liquids after midnight.   Take these medicines the morning of surgery with A SIP OF WATER: amiodarone   Do not wear jewelry, make-up or nail polish.  Do not wear lotions, powders, or perfumes. You may wear deodorant.  Do not shave 48 hours prior to surgery. Men may shave face and neck.  Do not bring valuables to the hospital.  [facility]                  for any belongings or valuables.               Contacts, dentures or bridgework may not be worn into surgery.  Leave suitcase in the car. After surgery it may be brought to your room.  For patients admitted to the hospital, discharge time is determined by your                treatment team.               Patients discharged the day of surgery will not be allowed to drive  home.  Name and phone number of your driver: family  Special Instructions: Incentive Spirometry - Practice and bring it with you on the day of surgery.   Please read over the following fact sheets that you were given: Pain Booklet, Coughing and Deep Breathing, Blood Transfusion Information, MRSA Information and Surgical Site Infection Prevention

## 2013-07-18 NOTE — Progress Notes (Signed)
Anesthesia Chart Review:  Patient is a 38 year old male scheduled for redo closure of ASD, repair anomalous pulmonary venous return, repair tricuspid valve on 07/20/13 by Dr. Cornelius Moraswen.  Other history includes prior ASD repair '99 [facility] complicated by post-operative respiratory failure and ultimately had left thoracotomy for lung biopsy, hospitalization for atypical chest pain 03/2013, chronic right sided CHF, non-smoker, DM2, appendectomy, back surgery.  No PCP is listed, but notes indicate it is Dr. Delbert Harnesson Diego.  Referring cardiologist to [facility] was Dr. Tonny BollmanMichael Cooper.  Preliminary carotid duplex on 07/17/13 showed: Bilateral: 1-39% ICA stenosis. Lowest end of range. Vertebral artery flow is antegrade.   Cardiac CT on 06/30/13 showed: 1. Coronary calcium score of 0. This was 0 percentile for age and sex matched control. Normal origin of coronary arteries, no evidence of CAD, left dominance.  2. 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 Dilated IVC overriding interatrial septum. 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  3. Dilated pulmonary arteries.  4. Severe right ventricular dilatation with right ventricular hypertrophy. Severely dilated right atrium.  5. Mild left ventricular hypertrophy, mildly dilated left atrium.  RHC on 06/27/13 showed: Hemodynamics:  RA 6  RV 37/9  PA 33/11 with a mean of 21  Pulmonary capillary wedge pressure 10  Pulmonary artery saturation 87%  Pulmonary venous saturation 96%  Coronary angiography:  Coronary dominance: right  Final Conclusions: Interatrial septal defect, not suitable for transcatheter closure because of deficient posterior rim.  TEE on 05/26/13 showed: - Left ventricle: The cavity size was normal. Wall thickness was normal. Systolic function was normal. The estimated ejection fraction was in the range of 55%  to 60%. Doppler parameters are consistent with abnormal left ventricular relaxation (grade 1 diastolic dysfunction). - Left atrium: No evidence of thrombus in the atrial cavity or appendage. - Right ventricle: The cavity size was severely dilated. Systolic function was moderately reduced. - Right atrium: The atrium was severely dilated. - Atrial septum: There is a secundum ASD with significant left to right shunt by color Doppler. The width of the defect is approximately 1.6 cm. Technically unable to obtain RVOT and LVOT Doppler images, not able to calculate Qp/Qs. - Tricuspid valve: Moderate-severe regurgitation. The regurgitation appears to be secondary to poor leaflet coaptation due to severe tricuspid anular dilatation.  Preoperative EKG, CXR, PFTs, and labs noted. I did route his A1C to Dr. Cornelius Moraswen, although he has already marked labs as reviewed.  Currently, patient's diabetes is not optimally controlled as evidenced by his elevated A1C of 11.7.  Will defer decision for urgency of procedure to Dr. Cornelius Moraswen (versus delaying to improve diabetes control).  Patient will get a fasting CBG on arrival and glucommander protocol for cardiac surgery.        Velna Ochsllison Moss Berry, PA-C [facility] Phone [phone] 07/18/2013 10:28 AM

## 2013-07-18 NOTE — H&P (Addendum)
[address]       [place] [address]             [phone]          CARDIOTHORACIC SURGERY HISTORY AND PHYSICAL EXAM  Referring Provider is [name], MD PCP is [name], MD    Chief Complaint   Patient presents with   .  Shortness of Breath       Surgical eval for large recurrent/persistent atrial septal defect, Cardiac Cath 06/27/13, Cardiac CT 06/30/13     HPI:  Patient is a 38 year old Hispanic male with history of congenital heart disease who underwent pericardial patch repair of what was reported to be a secundum type atrial septal defect by Dr. Buford Dresser at [facility] in [place] in 1999. The patient and his wife recall that he was initially discharged from the hospital within 4 or 5 days after surgery, but he was shortly thereafter readmitted with respiratory failure.  He was hospitalized for nearly 3 months during which time he underwent left thoracotomy for lung biopsy. He was told that he had pneumonia and significant liver dysfunction. 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  The patient states that he first began to experience worsening exertional shortness of breath over 6 months ago. He began to fatigue easily and ultimately lost his job 4 months ago because he could not keep pace.  Symptoms continued to progress until the patient was eventually hospitalized at [facility] in December with shortness of breath and atypical chest pain.   An echocardiogram demonstrated an enlarged right heart with left to right flow across the interatrial septum. A TEE was done and this confirmed a large  ASD.  He was referred to Dr. Excell Seltzer took him to the cath lab for possible closure. However, the patient was found to have anatomical characteristics unfavorable for percutaneous closure.  A cardiac gated CT angiogram of the heart was performed to better characterize the anatomy and the patient has been referred for  possible surgical intervention.  The patient is evaluated in the office today with his wife and parents present with the assistance of an interpreter. The patient describes a continued progression of symptoms of exertional shortness of breath over the past 6 months. The patient now get short of breath with minimal physical activity and occasionally at rest. He occasionally wakes up in the middle of the night feeling dyspneic. He denies orthopnea or lower extremity edema. He has not had dizzy spells or syncope. He has mild occasional symptoms of sharp stabbing pains in the left anterior chest that seemed to correspond with episodes of worsening shortness of breath. He has frequent palpitations.  He reports a chronic, dry, nonproductive cough. He denies any history of hemoptysis. He has not had fevers or chills.  He complains that he fatigues very easily.   Past Medical History  Diagnosis Date  . Type 2 diabetes mellitus   . Atrial septal defect   . Atrial septal defect, recurrent 06/16/2013  . Tricuspid regurgitation   . Diabetes type 2, uncontrolled 03/28/2013  . Atypical chest pain 03/27/2013  . Status post thoracotomy 12/14/1997    Left anterior thoracotomy for lung biopsy within 1 month of ASD closure @ [facility]   . Chronic right-sided congestive heart failure   . Right ventricular dysfunction   . S/P atrial septal defect closure 11/22/1997    Pericardial patch closure of reported secundum type ASD  via median sternotomy by Dr Buford Dresser @ [facility]   . Partial anomalous pulmonary venous return   . Shortness of breath     Past Surgical History  Procedure Laterality Date  . Asd repair  11/22/1997    Dr Buford Dresser @ [facility]  . Back surgery  2011  . Appendectomy  2012  . Tee without cardioversion N/A 05/26/2013    Procedure: TRANSESOPHAGEAL ECHOCARDIOGRAM (TEE);  Surgeon: Antoine Poche, MD;  Location: [facility];  Service: Cardiology;  Laterality: N/A;  . Thoracotomy Left 12/14/1997    Lung biopsy  @ [facility]    Family History  Problem Relation Age of Onset  . Diabetes Mellitus II      Social History History  Substance Use Topics  . Smoking status: Never Smoker   . Smokeless tobacco: Not on file  . Alcohol Use: No     Comment: History of alcohol abuse    Prior to Admission medications   Medication Sig Start Date End Date Taking? Authorizing Provider  amiodarone (PACERONE) 200 MG tablet Take 1 tablet (200 mg total) by mouth 2 (two) times daily. Begin 7 days prior to surgery. 07/07/13  Yes Purcell Nails, MD  aspirin EC 81 MG EC tablet Take 1 tablet (81 mg total) by mouth daily. 03/29/13  Yes Isabella Stalling, MD  metFORMIN (GLUCOPHAGE) 1000 MG tablet Take 1 tablet (1,000 mg total) by mouth 2 (two) times daily with a meal. 06/21/13  Yes [name], MD  nitroGLYCERIN (NITROSTAT) 0.4 MG SL tablet Place 1 tablet (0.4 mg total) under the tongue every 5 (five) minutes x 3 doses as needed for chest pain. 05/26/13  Yes Antoine Poche, MD    No Known Allergies    Review of Systems:              General:                      normal appetite, decreased energy, no weight gain, no weight loss, no fever             Cardiac:                      no chest pain with exertion, occasional chest pain at rest, + SOB with mild exertion, occasional resting SOB, + PND, no orthopnea, + palpitations, no arrhythmia, no atrial fibrillation, no LE edema, no dizzy spells, no syncope             Respiratory:                + shortness of breath, no home oxygen, no productive cough, + dry cough, no bronchitis, no wheezing, no hemoptysis, no asthma, no pain with inspiration or cough, + possible sleep apnea, no CPAP at night             GI:                                no difficulty swallowing, no reflux, no frequent heartburn, no hiatal hernia, no abdominal pain, no constipation, no diarrhea, no hematochezia, no hematemesis, no melena             GU:                              no dysuria,  no frequency,  no urinary tract infection, no hematuria, no enlarged prostate, no kidney stones, no kidney disease             Vascular:                     no pain suggestive of claudication, no pain in feet, no leg cramps, no varicose veins, no DVT, no non-healing foot ulcer             Neuro:                         no stroke, no TIA's, no seizures, no headaches, no temporary blindness one eye,  no slurred speech, no peripheral neuropathy, no chronic pain, no instability of gait, no memory/cognitive dysfunction             Musculoskeletal:         no arthritis, no joint swelling, no myalgias, no difficulty walking, no mobility               Skin:                            no rash, no itching, no skin infections, no pressure sores or ulcerations             Psych:                         no anxiety, no depression, no nervousness, + unusual recent stress             Eyes:                           no blurry vision, no floaters, no recent vision changes, no wears glasses or contacts             ENT:                            no hearing loss, no loose or painful teeth, no dentures, last saw dentist cannot recall             Hematologic:               no easy bruising, no abnormal bleeding, no clotting disorder, no frequent epistaxis             Endocrine:                   + diabetes, + checks CBG's at home                           Physical Exam:              BP 113/72  Pulse 60  Resp 20  Ht 5\' 5"  (1.651 m)  Wt 176 lb (79.833 kg)  BMI 29.29 kg/m2  SpO2 97%             General:                        well-appearing             HEENT:                       Unremarkable  Neck:                           no JVD, no bruits, no adenopathy               Chest:                         clear to auscultation, symmetrical breath sounds, no wheezes, no rhonchi               CV:                              RRR, soft systolic murmur, fixed split second heart sound             Abdomen:                     soft, non-tender, no masses               Extremities:                 warm, well-perfused, pulses diminished but palpable, no LE edema             Rectal/GU                   Deferred             Neuro:                         Grossly non-focal and symmetrical throughout             Skin:                            Clean and dry, no rashes, no breakdown   Diagnostic Tests:  Transthoracic Echocardiography  Patient:    Cayden, Granholm MR #:       [id number] Study Date: 03/28/2013 Gender:     M Age:        1 Height:     165.1cm Weight:     79.5kg BSA:        1.54m^2 Pt. Status: Room:       A336    Romana Juniper  ADMITTING    Vania Rea  PERFORMING   Delma Freeze Penn  SONOGRAPHER  Metro Kung, RDCS  ATTENDING    Rancour, Jeannett Senior cc:  ------------------------------------------------------------ LV EF: 60% -   65%  ------------------------------------------------------------ Indications:      Chest pain 786.51.  ------------------------------------------------------------ History:   Risk factors:  Diabetes mellitus.  ------------------------------------------------------------ Study Conclusions  - Left ventricle: The cavity size was normal. Wall thickness   was increased in a pattern of mild LVH. Systolic function   was normal. The estimated ejection fraction was in the   range of 60% to 65%. Wall motion was normal; there were no   regional wall motion abnormalities. No evidence of VSD.   Features are consistent with a pseudonormal left   ventricular filling pattern, with concomitant abnormal   relaxation and increased filling pressure (grade 2   diastolic dysfunction). - Ventricular septum: The contour showed diastolic   flattening and systolic flattening consistent with   increased RV pressure and volume. - Mitral valve: Trivial regurgitation. -  Left atrium: The atrium was mildly dilated. - Right  ventricle: The cavity size was severely dilated. - Right atrium: The atrium was severely dilated. - Atrial septum: No definite evidence of repair. Suggestive   of secundum ASD or PFO. The septum bowed from left to   right, consistent with increased left atrial pressure.   There was a left-to-right shunt. - Tricuspid valve: Dilated annulus with mild prolapse of the   anterior leaflet. Moderate regurgitation. - Pulmonary arteries: PA peak pressure: 42mm Hg (S). - Pericardium, extracardiac: There was no pericardial   effusion. Impressions:  - No prior study available for comparison. Mild LVH with   LVEF 60-65%, grade 2 diastolic dysfunction. Mild left   atrial enlargement. History indicates prior repair of   "hole in heart." There is evidence of RV pressure and   volume overload with severe RV enlargement as well.   Possible secundum ASD or PFO without definitive evidence   of repair. There is a left to right shunt by color   Doppler. Coronary sinus appears dilated. No VSD   visualized. Severe right atrial enlargement. Moderate   tricuspid regurgitation with PASP 42 mmHg. Suggest   obtaining prior operative report to further clarify.  ------------------------------------------------------------ Labs, prior tests, procedures, and surgery: Possible ASD or VSD repair 15 years ago Transthoracic echocardiography.  M-mode, complete 2D, spectral Doppler, and color Doppler.  Height:  Height: 165.1cm. Height: 65in.  Weight:  Weight: 79.5kg. Weight: 174.9lb.  Body mass index:  BMI: 29.2kg/m^2.  Body surface area:    BSA: 1.2028m^2.  Blood pressure:     99/46.  Patient status:  Inpatient.  Location:  Bedside.  ------------------------------------------------------------  ------------------------------------------------------------ Left ventricle:  The cavity size was normal. Wall thickness was increased in a pattern of mild LVH. Systolic function was normal. The estimated ejection fraction  was in the range of 60% to 65%. Wall motion was normal; there were no regional wall motion abnormalities. No evidence of VSD. Features are consistent with a pseudonormal left ventricular filling pattern, with concomitant abnormal relaxation and increased filling pressure (grade 2 diastolic dysfunction).   ------------------------------------------------------------ Aortic valve:   Trileaflet. Cusp separation was normal. Doppler:   No significant regurgitation.    VTI ratio of LVOT to aortic valve: 0.57. Peak velocity ratio of LVOT to aortic valve: 0.81.    Mean gradient: 5mm Hg (S).  ------------------------------------------------------------ Aorta:  Aortic root: The aortic root was normal in size.  ------------------------------------------------------------ Mitral valve:   The valve appears to be grossly normal. Doppler:   Trivial regurgitation.  ------------------------------------------------------------ Left atrium:  The atrium was mildly dilated.  ------------------------------------------------------------ Atrial septum:  No definite evidence of repair. Suggestive of secundum ASD or PFO. The septum bowed from left to right, consistent with increased left atrial pressure. There was a left-to-right shunt.  ------------------------------------------------------------ Right ventricle:  The cavity size was severely dilated. Systolic function was normal.  ------------------------------------------------------------ Ventricular septum:   The contour showed diastolic flattening and systolic flattening consistent with increased RV pressure and volume.  ------------------------------------------------------------ Pulmonic valve:    The valve appears to be grossly normal.  Doppler:   Trivial regurgitation.  ------------------------------------------------------------ Tricuspid valve:  Dilated annulus with mild prolapse of the anterior leaflet.  Doppler:   Moderate  regurgitation.  ------------------------------------------------------------ Right atrium:  The atrium was severely dilated.  ------------------------------------------------------------ Pericardium:  There was no pericardial effusion.  ------------------------------------------------------------ Systemic veins: Inferior vena cava: The vessel was dilated; the respirophasic diameter changes were in the normal range (= 50%).  ------------------------------------------------------------  2D measurements        Normal  Doppler measurements   Normal Left ventricle                 Main pulmonary LVID ED,   44.5 mm     43-52   artery chord,                         Pressure, S    42 mm   =30 PLAX                                             Hg LVID ES,   27.9 mm     23-38   Left ventricle chord,                         Ea, lat ann,  8.9 cm/s ------ PLAX                           tiss DP FS, chord,   37 %      >29     E/Ea, lat     7.8      ------ PLAX                           ann, tiss DP    8 LVPW, ED   12.2 mm     ------  Ea, med ann,  7.9 cm/s ------ IVS/LVPW   1.11        <1.3    tiss DP ratio, ED                      E/Ea, med     8.8      ------ Ventricular septum             ann, tiss DP    7 IVS, ED    13.6 mm     ------  LVOT Aorta                          Peak vel, S   79. cm/s ------ Root diam,   32 mm     ------                  8 ED                             VTI, S         14 cm   ------ Left atrium                    Aortic valve AP dim       55 mm     ------  Peak vel, S   99. cm/s ------ AP dim     2.94 cm/m^2 <2.2                    1 index                          Mean vel, S   106 cm/s ------ Right ventricle  VTI, S        24. cm   ------ RVID ED,   48.7 mm     19-38                   5 PLAX                           Mean            5 mm   ------                                gradient, S       Hg                                VTI ratio     0.5       ------                                LVOT/AV         7                                Peak vel      0.8      ------                                ratio,          1                                LVOT/AV                                Mitral valve                                Peak E vel    70. cm/s ------                                                1                                Peak A vel    23. cm/s ------                                                8                                Deceleration  215 ms   150-23  time                   0                                Peak E/A      2.9      ------                                ratio                                Tricuspid valve                                Regurg peak   293 cm/s ------                                vel                                Peak RV-RA     34 mm   ------                                gradient, S       Hg                                Max regurg    293 cm/s ------                                vel                                Systemic veins                                Estimated CVP   8 mm   ------                                                  Hg                                Right ventricle                                Pressure, S    42 mm   <30                                                  Hg  Sa vel, lat   14. cm/s ------                                ann, tiss DP    1   ------------------------------------------------------------ Prepared and Electronically Authenticated by  Nona Dell 2014-12-02T16:50:[id number]    Transesophageal Echocardiography  Patient:    Gurvir, Schrom MR #:       [id number] Study Date: 05/26/2013 Gender:     M Age:        37 Height:     165.1cm Weight:     79.4kg BSA:        1.37m^2 Pt. Status: Room:       APPO    SONOGRAPHER  Metro Kung, RDCS  ADMITTING     Patrick Jupiter, M.D.  ATTENDING    Patrick Jupiter, M.D.  Lisette Abu, M.D.  PERFORMING   Chmg, Page cc:  ------------------------------------------------------------ LV EF: 55% -   60%  ------------------------------------------------------------ Indications:      Atrial Septal Defect 745.5.  ------------------------------------------------------------ History:   PMH:   Chest pain.  Risk factors:  ASD/VSD repair 15 years ago at [facility] Diabetes mellitus.  ------------------------------------------------------------ Study Conclusions  - Study data: Technically adequate study. - Left ventricle: The cavity size was normal. Wall thickness   was normal. Systolic function was normal. The estimated   ejection fraction was in the range of 55% to 60%. Doppler   parameters are consistent with abnormal left ventricular   relaxation (grade 1 diastolic dysfunction). - Left atrium: No evidence of thrombus in the atrial cavity   or appendage. - Right ventricle: The cavity size was severely dilated.   Systolic function was moderately reduced. - Right atrium: The atrium was severely dilated. - Atrial septum: There is a secundum ASD with significant   left to right shunt by color Doppler. The width of the   defect is approximately 1.6 cm. Technically unable to   obtain RVOT and LVOT Doppler images, not able to calculate   Qp/Qs. - Tricuspid valve: Moderate-severe regurgitation. The   regurgitation appears to be secondary to poor leaflet   coaptation due to severe tricuspid anular dilatation. Transesophageal echocardiography.  2D and color Doppler. Height:  Height: 165.1cm. Height: 65in.  Weight:  Weight: 79.4kg. Weight: 174.6lb.  Body mass index:  BMI: 29.1kg/m^2.  Body surface area:    BSA: 1.56m^2.  Blood pressure: 111/83.  Patient status:  Outpatient.  Location:  Endoscopy.    ------------------------------------------------------------  ------------------------------------------------------------ Left ventricle:  The cavity size was normal. Wall thickness was normal. Systolic function was normal. The estimated ejection fraction was in the range of 55% to 60%. Doppler parameters are consistent with abnormal left ventricular relaxation (grade 1 diastolic dysfunction).  ------------------------------------------------------------ Aortic valve:   Trileaflet; normal thickness leaflets. There appears to be a Lambls excrescence attached to the left coronary cusp.  Doppler:   There was no stenosis.    No significant regurgitation.  ------------------------------------------------------------ Aorta:  There is no significant plaque/calcification of the visualized portions of the descending aorta. Aortic root: The aortic root was normal in size.  ------------------------------------------------------------ Mitral valve:   Normal thickness leaflets .  Doppler: There was no evidence for stenosis.    No significant regurgitation.  ------------------------------------------------------------ Left atrium:  The atrium was normal in size.  No evidence of thrombus in the atrial cavity or appendage. The appendage was of normal size. Emptying velocity was  normal.  ------------------------------------------------------------ Atrial septum:  There is a secundum ASD with significant left to right shunt by color Doppler. The width of the defect is approximately 1.6 cm. Technically unable to obtain RVOT and LVOT Doppler images, not able to calculate Qp/Qs.   ------------------------------------------------------------ Right ventricle:  The cavity size was severely dilated. Systolic function was moderately reduced.  ------------------------------------------------------------ Pulmonic valve:   Not well visualized.  Doppler:   There was no evidence for stenosis.    No  significant regurgitation.   ------------------------------------------------------------ Tricuspid valve:   Doppler:   There was no evidence for stenosis.    Moderate-severe regurgitation. The TR vena contracta is 0.4 cm. The regurgitation appears to be secondary to poor leaflet coaptation due to severe tricuspid anular dilatation.  ------------------------------------------------------------ Right atrium:  The atrium was severely dilated.   ------------------------------------------------------------ Post procedure conclusions Ascending Aorta:  - There is no significant plaque/calcification of the   visualized portions of the descending aorta.  ------------------------------------------------------------  2D measurements     Normal     Doppler measurements   Normal LVOT                           Mitral valve Diam, S     21 mm   ------     Peak E vel    48. cm/s ------ Area      3.46 cm^2 ------                     4 Aorta                          Peak A vel    85. cm/s ------ Root diam   30 mm   ------                     1                                Deceleration  180 ms   150-23                                time                   0                                Peak E/A      0.6      ------                                ratio   ------------------------------------------------------------ Prepared and Electronically Authenticated by  Patrick Jupiter, M.D. 2015-01-30T17:37:[id number]    Cardiac Catheterization Procedure Note  Name: Sheryl Towell MRN: [id number] DOB: 1976-03-11  Procedure: Right Heart Catheterization, Intracardiac echocardiography  Indication: Atrial Septal Defect. This is a 38 year old Hispanic gentleman who underwent surgical repair of a large ASD approximately 20 years ago at [facility]. He presented with progressive shortness of breath and echocardiography demonstrated an enlarged right heart. The patient underwent a  transesophageal echo demonstrating recurrence of his ASD. The defect measured approximately 15 mm in diameter and appeared to be in  the midportion of the interatrial septum by TEE. He presents today for planned transcatheter closure of this defect.         Procedural details: The right groin was prepped, draped, and anesthetized with 1% lidocaine. Using modified Seldinger technique, an 8 French sheath was introduced into the right femoral vein and a 9 French sheath was introduced into the right femoral vein just distal to the original sheath. A Swan-Ganz catheter was used to draw oxygen saturations and record right heart pressures. An intracardiac echo probe was advanced into the right atrium and imaging was performed. The patient was given weight-based unfractionated heparin.  Intracardiac echo imaging demonstrated a deficient posterior rim of the ASD, functionally making this a primum ASD. Extensive imaging was performed. The defect was not suitable for transcatheter closure because of this deficient posterior rim. The intracardiac echo probe and Swan-Ganz catheter were removed and the patient was transferred to the recovery room in stable condition. His sheaths will be pulled once his ACT is less than 175 and manual pressure will be used for hemostasis.  Procedural Findings: Hemodynamics:  Hemodynamics: RA 6 RV 37/9 PA 33/11 with a mean of 21 Pulmonary capillary wedge pressure 10  Pulmonary artery saturation 87% Pulmonary venous saturation 96%              Coronary angiography: Coronary dominance: right  Final Conclusions:    Interatrial septal defect, not suitable for transcatheter closure because of deficient posterior rim  Recommendations: Will obtain a gated cardiac CT to better define the anatomy of the patient's interatrial septal defect and pulmonary veins. Will refer the patient to cardiac surgery.  Contrast: none Complications: no immediate complications  [name] 06/27/2013, 11:56 AM      Cardiac/Coronary  CT    TECHNIQUE:   The patient was scanned on a Philips 256 scanner.   FINDINGS:   A 120 kV prospective scan was triggered in the ascending thoracic   aorta at 111 HU's. Axial non-contrast 3mm slices were carried out   through the heart. The data set was analyzed on a dedicated work   station and scored using the Agatson method. Gantry rotation speed   was 270 msecs and collimation was .9 mm. 2.5 mg of iv Metoprolol and   0.4 mg of sl NTG was given. The 3D data set was reconstructed in 5%   intervals of the 67-82 % of the R-R cycle. Diastolic phases were   analyzed on a dedicated work station using MPR, MIP and VRT modes.   The patient received 100 cc of contrast. Heart rate at the time of acquisition was 56/minute.   1. Coronary Arteries:   Coronary arteries originate in the normal position. There is left dominance.   Left main gives rise to LAD and left circumflex artery.   Left main is a large caliber vessel with no plague.   LAD is a large caliber vessel that has no CAD. It gives rise to 1 diagonal branch without any plague.   LCX artery is a large dominant vessel that gives rise to an obtuse marginal branch, PDA and a very small PLVB. There is no evidence of plague.   RCA is a small non-dominant vessel that supply few acute marginal branches to the severely enlarged and hypertrophied right ventricle. There is no plague.   2. There is a large inferior sinus venosus type ASD located in the inferior portion of the interatrial septum with direct continuation into left lateral wall of  the most superior portion of IVC (IVC overring interatrial septum). As a consequence there is very thin and short inferior rim.   The diameters are as follows: Antero-posterior 22 mm, superio-inferior 21 mm.   Rims measurements:   Anterior:  14 mm   Posterior: 12 mm, minimum 6 mm at the most inferior portion   Superior:  19 mm   Inferior:  5 mm   There is possible partial anomalous pulmonary venous return with RUPV (right upper pulmonary vein) draining into the SVC. The remaining 4 pulmonary veins (RMPV, PLPV, LUPV and LLPV) drain in a normal position into the left atrium.   3. Dilated main pulmonary artery measuring 43 x 36 mm. Dilated right pulmonary artery measuring 28 x 28 mm, left pulmonary artery measuring 31 x 30 mm. Increased pulmonary vasculature consistent with pulmonary hypertension.   4. Severe dilatation of the right ventricle with mild RVH and hypertrabeculation. Severely dilated right atrium.   Dilated IVC measuring 39 x 28 mm.   Intact coronary sinus.   3.  Mild left ventricular hypertrophy, mildly dilated left atrium.   IMPRESSION:   1. Coronary calcium score of 0. This was 0 percentile for age and sex matched control. Normal origin of coronary arteries, no evidence of CAD, left dominance.   2. 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 Dilated IVC overriding interatrial septum. 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   3. Dilated pulmonary arteries.   4. Severe right ventricular dilatation with right ventricular hypertrophy. Severely dilated right atrium.   5. Mild left ventricular hypertrophy, mildly dilated left atrium.   Tobias Alexander     Electronically Signed   By: Tobias Alexander   On: 07/02/2013 11:47         Study Result      EXAM:  OVER-READ INTERPRETATION  CT CHEST   The following report is an over-read performed by radiologist Dr. Royal Piedra [facility] on 06/30/2013. This over-read does not include interpretation of cardiac or coronary anatomy or pathology. The interpretation by the cardiologist is attached.   COMPARISON:  No priors.   FINDINGS: Architectural distortion and in the anterior  aspects of the upper lobes of the lungs bilaterally, and the right middle lobe, most compatible with chronic scarring (likely related to prior surgery). This is rather mild. Within the visualized portions of the thorax there is no acute consolidative airspace disease, no suspicious appearing pulmonary nodule or mass, and no pleural effusion. Several small pleural calcifications in the anterior aspect of the left hemithorax are noted. Visualized portions of the upper abdomen are remarkable for calcifications in the spleen, presumably granulomas. No aggressive appearing lytic or blastic lesions are noted within the visualized portions of the skeleton. Sternotomy wires.   IMPRESSION: 1. Mild postoperative scarring in the paramediastinal aspect of the lungs bilaterally. Otherwise, no significant noncardiac findings noted.   Electronically Signed: By: Trudie Reed M.D. 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  I suspect the patient originally had a sinus venosus-type atrial septal defect that was missed at the time of his original surgery.  Interestingly, the patient suffered from respiratory failure after his initial  operation 15 years ago.  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  There is moderate to severe right ventricular chamber enlargement with moderate to severe tricuspid regurgitation.  Pulmonary artery pressures are only very mildly elevated and the shunt across the atrial septal defect remains entirely left to right.    Plan:  With the assistance of an interpreter the situation was explained at length with the  patient and his entire family in the office today. The need for redo median sternotomy for redo closure of the atrial septal defect with repair of partial anomalous pulmonary venous return and possible tricuspid valve repair were discussed.  Risks associated with surgical intervention have been discussed at length.  Long-term prognosis with continued medical therapy without surgery was discussed at length. The possibility of referral to a tertiary care center where adult congenital heart surgery is performed more commonly has also been offered as an alternative.  We plan to proceed with surgery on Thursday, 07/20/2013.  The patient and his family understand and accept all potential risks of surgery including but not limited to risk of death, stroke or other neurologic complication, myocardial infarction, acute and/or chronic heart failure potentially requiring mechanical circulatory support, respiratory failure, renal failure, liver failure, bleeding requiring transfusion and/or reexploration, arrhythmia, heart block or bradycardia potentially requiring permanent pacemaker, infection or other wound complications, pneumonia, pleural and/or pericardial effusion, pulmonary embolus, aortic dissection or other major vascular complication, pulmonary venous obstruction, or delayed complications related to tricuspid valve repair or replacement including but not limited to structural valve deterioration and failure, thrombosis, embolization, endocarditis, or paravalvular leak.  All of their questions have been answered.  The patient has been instructed to stop taking aspirin. He has also been given a prescription for amiodarone to begin prior to surgery to decrease his risk of perioperative arrhythmias.     Salvatore Decent. Cornelius Moras, MD

## 2013-07-18 NOTE — Anesthesia Preprocedure Evaluation (Addendum)
Anesthesia Evaluation  Patient identified by MRN, date of birth, ID band Patient awake    Reviewed: Allergy & Precautions, H&P , NPO status , Patient's Chart, lab work & pertinent test results, reviewed documented beta blocker date and time   Airway Mallampati: II TM Distance: >3 FB Neck ROM: Full    Dental  (+) Teeth Intact, Dental Advisory Given   Pulmonary shortness of breath and with exertion,          Cardiovascular +CHF + Valvular Problems/Murmurs  ASD, Tricuspid regurg   Neuro/Psych    GI/Hepatic   Endo/Other  diabetes, Poorly Controlled, Type 2  Renal/GU      Musculoskeletal   Abdominal   Peds  Hematology   Anesthesia Other Findings   Reproductive/Obstetrics                         Anesthesia Physical Anesthesia Plan  ASA: III  Anesthesia Plan: General   Post-op Pain Management:    Induction: Intravenous  Airway Management Planned: Oral ETT  Additional Equipment: Ultrasound Guidance Line Placement, 3D TEE and Arterial line  Intra-op Plan: Utilization Of Total Body Hypothermia per surgeon request  Post-operative Plan: Post-operative intubation/ventilation  Informed Consent: I have reviewed the patients History and Physical, chart, labs and discussed the procedure including the risks, benefits and alternatives for the proposed anesthesia with the patient or authorized representative who has indicated his/her understanding and acceptance.   Dental advisory given  Plan Discussed with: CRNA, Surgeon and Anesthesiologist  Anesthesia Plan Comments: (Per Dr. Cornelius Moraswen: NO NECK LINES IN RIGHT NECK AND NO SWAN GANZ.  Shonna ChockAllison Zelenak, PA-C)      Anesthesia Quick Evaluation

## 2013-07-18 NOTE — Progress Notes (Signed)
Pt was also instructed that he could get the [facility]. [place] in [place] and [facility](no ins). Pharmacy was speaking with relative that spoke english.

## 2013-07-19 MED ORDER — DOPAMINE-DEXTROSE 3.2-5 MG/ML-% IV SOLN
2.0000 ug/kg/min | INTRAVENOUS | Status: DC
  Filled 2013-07-19: qty 250

## 2013-07-19 MED ORDER — PLASMA-LYTE 148 IV SOLN
INTRAVENOUS | Status: AC
  Administered 2013-07-20: 09:00:00
  Filled 2013-07-19: qty 2.5

## 2013-07-19 MED ORDER — DEXMEDETOMIDINE HCL IN NACL 400 MCG/100ML IV SOLN
0.1000 ug/kg/h | INTRAVENOUS | Status: DC
  Filled 2013-07-19: qty 100

## 2013-07-19 MED ORDER — METOPROLOL TARTRATE 12.5 MG HALF TABLET
12.5000 mg | ORAL_TABLET | Freq: Once | ORAL | Status: AC
  Administered 2013-07-20: 12.5 mg via ORAL
  Filled 2013-07-19: qty 1

## 2013-07-19 MED ORDER — CEFUROXIME SODIUM 1.5 G IJ SOLR
1.5000 g | INTRAMUSCULAR | Status: AC
  Administered 2013-07-20: .75 g via INTRAVENOUS
  Administered 2013-07-20: 1.5 g via INTRAVENOUS
  Filled 2013-07-19: qty 1.5

## 2013-07-19 MED ORDER — VANCOMYCIN HCL 1000 MG IV SOLR
INTRAVENOUS | Status: AC
  Administered 2013-07-20: 11:00:00
  Filled 2013-07-19: qty 1000

## 2013-07-19 MED ORDER — SODIUM CHLORIDE 0.9 % IV SOLN
INTRAVENOUS | Status: DC
  Filled 2013-07-19: qty 1

## 2013-07-19 MED ORDER — EPINEPHRINE HCL 1 MG/ML IJ SOLN
0.5000 ug/min | INTRAMUSCULAR | Status: DC
  Filled 2013-07-19: qty 4

## 2013-07-19 MED ORDER — VANCOMYCIN HCL 10 G IV SOLR
1250.0000 mg | INTRAVENOUS | Status: AC
  Administered 2013-07-20: 1250 mg via INTRAVENOUS
  Filled 2013-07-19: qty 1250

## 2013-07-19 MED ORDER — SODIUM CHLORIDE 0.9 % IV SOLN
INTRAVENOUS | Status: DC
  Filled 2013-07-19: qty 30

## 2013-07-19 MED ORDER — PHENYLEPHRINE HCL 10 MG/ML IJ SOLN
30.0000 ug/min | INTRAMUSCULAR | Status: DC
  Filled 2013-07-19: qty 2

## 2013-07-19 MED ORDER — SODIUM CHLORIDE 0.9 % IV SOLN
INTRAVENOUS | Status: DC
  Filled 2013-07-19: qty 40

## 2013-07-19 MED ORDER — MAGNESIUM SULFATE 50 % IJ SOLN
40.0000 meq | INTRAMUSCULAR | Status: DC
  Filled 2013-07-19: qty 10

## 2013-07-19 MED ORDER — NITROGLYCERIN IN D5W 200-5 MCG/ML-% IV SOLN
2.0000 ug/min | INTRAVENOUS | Status: DC
  Filled 2013-07-19: qty 250

## 2013-07-19 MED ORDER — DEXTROSE 5 % IV SOLN
750.0000 mg | INTRAVENOUS | Status: DC
  Filled 2013-07-19: qty 750

## 2013-07-19 MED ORDER — POTASSIUM CHLORIDE 2 MEQ/ML IV SOLN
80.0000 meq | INTRAVENOUS | Status: DC
  Filled 2013-07-19: qty 40

## 2013-07-19 NOTE — Progress Notes (Signed)
Please treat pt with Mupirocin ointment on DOS; pt unable to obtain medication.

## 2013-07-20 ENCOUNTER — Encounter (HOSPITAL_COMMUNITY)
Admission: RE | Disposition: A | Discharge status: Home / Self Care | Payer: Medicaid Other | Source: Ambulatory Visit | Attending: Thoracic Surgery (Cardiothoracic Vascular Surgery)

## 2013-07-20 ENCOUNTER — Inpatient Hospital Stay (HOSPITAL_COMMUNITY)
Admission: RE | Admit: 2013-07-20 | Discharge: 2013-07-26 | DRG: 220 | Disposition: A | Discharge status: Home / Self Care | Payer: Medicaid Other | Source: Ambulatory Visit | Attending: Thoracic Surgery (Cardiothoracic Vascular Surgery) | Admitting: Thoracic Surgery (Cardiothoracic Vascular Surgery)

## 2013-07-20 ENCOUNTER — Inpatient Hospital Stay (HOSPITAL_COMMUNITY): Payer: Medicaid Other

## 2013-07-20 ENCOUNTER — Inpatient Hospital Stay (HOSPITAL_COMMUNITY): Payer: Medicaid Other | Admitting: Vascular Surgery

## 2013-07-20 ENCOUNTER — Encounter (HOSPITAL_COMMUNITY): Payer: Self-pay | Admitting: *Deleted

## 2013-07-20 ENCOUNTER — Encounter (HOSPITAL_COMMUNITY): Payer: Medicaid Other | Admitting: Vascular Surgery

## 2013-07-20 DIAGNOSIS — Q263 Partial anomalous pulmonary venous connection: Secondary | ICD-10-CM

## 2013-07-20 DIAGNOSIS — Z7982 Long term (current) use of aspirin: Secondary | ICD-10-CM

## 2013-07-20 DIAGNOSIS — Q211 Atrial septal defect, unspecified: Secondary | ICD-10-CM

## 2013-07-20 DIAGNOSIS — I079 Rheumatic tricuspid valve disease, unspecified: Secondary | ICD-10-CM

## 2013-07-20 DIAGNOSIS — IMO0002 Reserved for concepts with insufficient information to code with codable children: Secondary | ICD-10-CM | POA: Diagnosis present

## 2013-07-20 DIAGNOSIS — Q2111 Secundum atrial septal defect: Secondary | ICD-10-CM

## 2013-07-20 DIAGNOSIS — R0789 Other chest pain: Secondary | ICD-10-CM

## 2013-07-20 DIAGNOSIS — I509 Heart failure, unspecified: Secondary | ICD-10-CM | POA: Diagnosis present

## 2013-07-20 DIAGNOSIS — Z9889 Other specified postprocedural states: Secondary | ICD-10-CM

## 2013-07-20 DIAGNOSIS — F1010 Alcohol abuse, uncomplicated: Secondary | ICD-10-CM | POA: Diagnosis present

## 2013-07-20 DIAGNOSIS — Z8774 Personal history of (corrected) congenital malformations of heart and circulatory system: Secondary | ICD-10-CM

## 2013-07-20 DIAGNOSIS — E1165 Type 2 diabetes mellitus with hyperglycemia: Secondary | ICD-10-CM | POA: Diagnosis present

## 2013-07-20 DIAGNOSIS — I519 Heart disease, unspecified: Secondary | ICD-10-CM | POA: Diagnosis present

## 2013-07-20 DIAGNOSIS — Z833 Family history of diabetes mellitus: Secondary | ICD-10-CM

## 2013-07-20 DIAGNOSIS — D62 Acute posthemorrhagic anemia: Secondary | ICD-10-CM | POA: Diagnosis not present

## 2013-07-20 DIAGNOSIS — R739 Hyperglycemia, unspecified: Secondary | ICD-10-CM

## 2013-07-20 DIAGNOSIS — I50812 Chronic right heart failure: Secondary | ICD-10-CM | POA: Diagnosis present

## 2013-07-20 DIAGNOSIS — I498 Other specified cardiac arrhythmias: Secondary | ICD-10-CM | POA: Diagnosis present

## 2013-07-20 DIAGNOSIS — I071 Rheumatic tricuspid insufficiency: Secondary | ICD-10-CM | POA: Diagnosis present

## 2013-07-20 DIAGNOSIS — D6959 Other secondary thrombocytopenia: Secondary | ICD-10-CM | POA: Diagnosis present

## 2013-07-20 DIAGNOSIS — IMO0001 Reserved for inherently not codable concepts without codable children: Secondary | ICD-10-CM | POA: Diagnosis not present

## 2013-07-20 DIAGNOSIS — Z6829 Body mass index (BMI) 29.0-29.9, adult: Secondary | ICD-10-CM

## 2013-07-20 HISTORY — DX: Other specified postprocedural states: Z98.890

## 2013-07-20 HISTORY — PX: ASD REPAIR: SHX258

## 2013-07-20 HISTORY — PX: TRICUSPID VALVE REPLACEMENT: SHX816

## 2013-07-20 HISTORY — PX: INTRAOPERATIVE TRANSESOPHAGEAL ECHOCARDIOGRAM: SHX5062

## 2013-07-20 LAB — POCT I-STAT 4, (NA,K, GLUC, HGB,HCT)
Glucose, Bld: 239 mg/dL — ABNORMAL HIGH (ref 70–99)
Glucose, Bld: 168 mg/dL — ABNORMAL HIGH (ref 70–99)
Glucose, Bld: 184 mg/dL — ABNORMAL HIGH (ref 70–99)
Glucose, Bld: 196 mg/dL — ABNORMAL HIGH (ref 70–99)
Glucose, Bld: 155 mg/dL — ABNORMAL HIGH (ref 70–99)
Glucose, Bld: 157 mg/dL — ABNORMAL HIGH (ref 70–99)
Glucose, Bld: 113 mg/dL — ABNORMAL HIGH (ref 70–99)
Glucose, Bld: 122 mg/dL — ABNORMAL HIGH (ref 70–99)
Glucose, Bld: 95 mg/dL (ref 70–99)
HCT: 40 % (ref 39.0–52.0)
HCT: 33 % — ABNORMAL LOW (ref 39.0–52.0)
HCT: 40 % (ref 39.0–52.0)
HCT: 33 % — ABNORMAL LOW (ref 39.0–52.0)
HCT: 24 % — ABNORMAL LOW (ref 39.0–52.0)
HCT: 37 % — ABNORMAL LOW (ref 39.0–52.0)
HEMATOCRIT: 41 % (ref 39.0–52.0)
HEMATOCRIT: 33 % — AB (ref 39.0–52.0)
HEMATOCRIT: 28 % — AB (ref 39.0–52.0)
HEMOGLOBIN: 11.2 g/dL — AB (ref 13.0–17.0)
HEMOGLOBIN: 13.9 g/dL (ref 13.0–17.0)
HEMOGLOBIN: 11.2 g/dL — AB (ref 13.0–17.0)
HEMOGLOBIN: 11.2 g/dL — AB (ref 13.0–17.0)
Hemoglobin: 13.6 g/dL (ref 13.0–17.0)
Hemoglobin: 13.6 g/dL (ref 13.0–17.0)
Hemoglobin: 8.2 g/dL — ABNORMAL LOW (ref 13.0–17.0)
Hemoglobin: 9.5 g/dL — ABNORMAL LOW (ref 13.0–17.0)
Hemoglobin: 12.6 g/dL — ABNORMAL LOW (ref 13.0–17.0)
POTASSIUM: 4 meq/L (ref 3.7–5.3)
Potassium: 3.8 mEq/L (ref 3.7–5.3)
Potassium: 4 mEq/L (ref 3.7–5.3)
Potassium: 4.3 mEq/L (ref 3.7–5.3)
Potassium: 6.1 mEq/L — ABNORMAL HIGH (ref 3.7–5.3)
Potassium: 6.2 mEq/L — ABNORMAL HIGH (ref 3.7–5.3)
Potassium: 3.5 mEq/L — ABNORMAL LOW (ref 3.7–5.3)
Potassium: 3.9 mEq/L (ref 3.7–5.3)
Potassium: 3.3 mEq/L — ABNORMAL LOW (ref 3.7–5.3)
SODIUM: 140 meq/L (ref 137–147)
SODIUM: 141 meq/L (ref 137–147)
SODIUM: 141 meq/L (ref 137–147)
Sodium: 138 mEq/L (ref 137–147)
Sodium: 140 mEq/L (ref 137–147)
Sodium: 140 mEq/L (ref 137–147)
Sodium: 141 mEq/L (ref 137–147)
Sodium: 142 mEq/L (ref 137–147)
Sodium: 142 mEq/L (ref 137–147)

## 2013-07-20 LAB — CBC
HCT: 37.2 % — ABNORMAL LOW (ref 39.0–52.0)
HEMATOCRIT: 34.9 % — AB (ref 39.0–52.0)
Hemoglobin: 13.5 g/dL (ref 13.0–17.0)
Hemoglobin: 12.8 g/dL — ABNORMAL LOW (ref 13.0–17.0)
MCH: 32.9 pg (ref 26.0–34.0)
MCH: 33.2 pg (ref 26.0–34.0)
MCHC: 36.3 g/dL — ABNORMAL HIGH (ref 30.0–36.0)
MCHC: 36.7 g/dL — AB (ref 30.0–36.0)
MCV: 90.7 fL (ref 78.0–100.0)
MCV: 90.6 fL (ref 78.0–100.0)
PLATELETS: 85 10*3/uL — AB (ref 150–400)
Platelets: 92 10*3/uL — ABNORMAL LOW (ref 150–400)
RBC: 4.1 MIL/uL — ABNORMAL LOW (ref 4.22–5.81)
RBC: 3.85 MIL/uL — ABNORMAL LOW (ref 4.22–5.81)
RDW: 12.3 % (ref 11.5–15.5)
RDW: 12.5 % (ref 11.5–15.5)
WBC: 10 10*3/uL (ref 4.0–10.5)
WBC: 11.4 10*3/uL — ABNORMAL HIGH (ref 4.0–10.5)

## 2013-07-20 LAB — GLUCOSE, CAPILLARY
GLUCOSE-CAPILLARY: 232 mg/dL — AB (ref 70–99)
Glucose-Capillary: 80 mg/dL (ref 70–99)
Glucose-Capillary: 88 mg/dL (ref 70–99)
Glucose-Capillary: 110 mg/dL — ABNORMAL HIGH (ref 70–99)
Glucose-Capillary: 109 mg/dL — ABNORMAL HIGH (ref 70–99)
Glucose-Capillary: 136 mg/dL — ABNORMAL HIGH (ref 70–99)

## 2013-07-20 LAB — POCT I-STAT 3, VENOUS BLOOD GAS (G3P V)
ACID-BASE DEFICIT: 2 mmol/L (ref 0.0–2.0)
BICARBONATE: 24.4 meq/L — AB (ref 20.0–24.0)
O2 SAT: 72 %
PO2 VEN: 42 mmHg (ref 30.0–45.0)
TCO2: 26 mmol/L (ref 0–100)
pCO2, Ven: 47.2 mmHg (ref 45.0–50.0)
pH, Ven: 7.32 — ABNORMAL HIGH (ref 7.250–7.300)

## 2013-07-20 LAB — POCT I-STAT 3, ART BLOOD GAS (G3+)
Acid-Base Excess: 1 mmol/L (ref 0.0–2.0)
Acid-base deficit: 2 mmol/L (ref 0.0–2.0)
Acid-base deficit: 1 mmol/L (ref 0.0–2.0)
Acid-base deficit: 1 mmol/L (ref 0.0–2.0)
BICARBONATE: 24.2 meq/L — AB (ref 20.0–24.0)
Bicarbonate: 24.2 mEq/L — ABNORMAL HIGH (ref 20.0–24.0)
Bicarbonate: 24.5 mEq/L — ABNORMAL HIGH (ref 20.0–24.0)
Bicarbonate: 26 mEq/L — ABNORMAL HIGH (ref 20.0–24.0)
O2 SAT: 100 %
O2 SAT: 94 %
O2 Saturation: 100 %
O2 Saturation: 100 %
PCO2 ART: 42.5 mmHg (ref 35.0–45.0)
PCO2 ART: 40.2 mmHg (ref 35.0–45.0)
PH ART: 7.356 (ref 7.350–7.450)
PH ART: 7.387 (ref 7.350–7.450)
PH ART: 7.393 (ref 7.350–7.450)
PO2 ART: 427 mmHg — AB (ref 80.0–100.0)
PO2 ART: 284 mmHg — AB (ref 80.0–100.0)
TCO2: 25 mmol/L (ref 0–100)
TCO2: 26 mmol/L (ref 0–100)
TCO2: 27 mmol/L (ref 0–100)
TCO2: 25 mmol/L (ref 0–100)
pCO2 arterial: 43.2 mmHg (ref 35.0–45.0)
pCO2 arterial: 40.8 mmHg (ref 35.0–45.0)
pH, Arterial: 7.388 (ref 7.350–7.450)
pO2, Arterial: 275 mmHg — ABNORMAL HIGH (ref 80.0–100.0)
pO2, Arterial: 70 mmHg — ABNORMAL LOW (ref 80.0–100.0)

## 2013-07-20 LAB — CREATININE, SERUM
Creatinine, Ser: 0.56 mg/dL (ref 0.50–1.35)
GFR calc Af Amer: >90 mL/min (ref >90)
GFR calc non Af Amer: >90 mL/min (ref >90)

## 2013-07-20 LAB — PROTIME-INR
INR: 1.67 — AB (ref 0.00–1.49)
PROTHROMBIN TIME: 19.2 s — AB (ref 11.6–15.2)

## 2013-07-20 LAB — HEMOGLOBIN AND HEMATOCRIT, BLOOD
HEMATOCRIT: 29.9 % — AB (ref 39.0–52.0)
HEMOGLOBIN: 10.9 g/dL — AB (ref 13.0–17.0)

## 2013-07-20 LAB — APTT: APTT: 33 s (ref 24–37)

## 2013-07-20 LAB — MAGNESIUM: Magnesium: 2.3 mg/dL (ref 1.5–2.5)

## 2013-07-20 LAB — PREPARE RBC (CROSSMATCH)

## 2013-07-20 LAB — PLATELET COUNT: Platelets: 118 10*3/uL — ABNORMAL LOW (ref 150–400)

## 2013-07-20 SURGERY — REPAIR, ATRIAL SEPTAL DEFECT
Anesthesia: General | Site: Chest

## 2013-07-20 MED ORDER — FENTANYL CITRATE 0.05 MG/ML IJ SOLN
INTRAMUSCULAR | Status: AC
  Filled 2013-07-20: qty 5

## 2013-07-20 MED ORDER — SODIUM CHLORIDE 0.9 % IR SOLN
Status: DC | PRN
  Administered 2013-07-20: 3000 mL

## 2013-07-20 MED ORDER — DEXMEDETOMIDINE HCL IN NACL 200 MCG/50ML IV SOLN
0.1000 ug/kg/h | INTRAVENOUS | Status: DC
Start: 2013-07-20 — End: 2013-07-21
  Administered 2013-07-20: 0.7 ug/kg/h via INTRAVENOUS
  Filled 2013-07-20: qty 50

## 2013-07-20 MED ORDER — MORPHINE SULFATE 2 MG/ML IJ SOLN
2.0000 mg | INTRAMUSCULAR | Status: DC | PRN
  Administered 2013-07-21: 2 mg via INTRAVENOUS
  Administered 2013-07-21: 4 mg via INTRAVENOUS
  Filled 2013-07-20: qty 2
  Filled 2013-07-20 (×3): qty 1

## 2013-07-20 MED ORDER — LACTATED RINGERS IV SOLN: INTRAVENOUS | Status: DC

## 2013-07-20 MED ORDER — OXYCODONE HCL 5 MG PO TABS
5.0000 mg | ORAL_TABLET | ORAL | Status: DC | PRN
  Administered 2013-07-21 – 2013-07-22 (×8): 10 mg via ORAL
  Administered 2013-07-22 (×2): 5 mg via ORAL
  Administered 2013-07-23 – 2013-07-26 (×12): 10 mg via ORAL
  Filled 2013-07-20 (×14): qty 2
  Filled 2013-07-20: qty 1
  Filled 2013-07-20 (×7): qty 2

## 2013-07-20 MED ORDER — SODIUM CHLORIDE 0.9 % IV SOLN
INTRAVENOUS | Status: DC
  Administered 2013-07-20: 16:00:00 via INTRAVENOUS

## 2013-07-20 MED ORDER — LACTATED RINGERS IV SOLN
INTRAVENOUS | Status: DC | PRN
  Administered 2013-07-20: 07:00:00 via INTRAVENOUS

## 2013-07-20 MED ORDER — MUPIROCIN 2 % EX OINT
TOPICAL_OINTMENT | CUTANEOUS | Status: AC
  Filled 2013-07-20: qty 22

## 2013-07-20 MED ORDER — EPHEDRINE SULFATE 50 MG/ML IJ SOLN
INTRAMUSCULAR | Status: AC
  Filled 2013-07-20: qty 1

## 2013-07-20 MED ORDER — METOPROLOL TARTRATE 25 MG/10 ML ORAL SUSPENSION
12.5000 mg | Freq: Two times a day (BID) | ORAL | Status: DC
  Filled 2013-07-20 (×3): qty 5

## 2013-07-20 MED ORDER — ACETAMINOPHEN 650 MG RE SUPP: 650.0000 mg | Freq: Once | RECTAL | Status: AC

## 2013-07-20 MED ORDER — PROPOFOL 10 MG/ML IV BOLUS
INTRAVENOUS | Status: DC | PRN
  Administered 2013-07-20: 50 mg via INTRAVENOUS

## 2013-07-20 MED ORDER — ASPIRIN 81 MG PO CHEW: 324.0000 mg | CHEWABLE_TABLET | Freq: Every day | ORAL | Status: DC

## 2013-07-20 MED ORDER — MIDAZOLAM HCL 2 MG/2ML IJ SOLN
INTRAMUSCULAR | Status: AC
  Filled 2013-07-20: qty 2

## 2013-07-20 MED ORDER — MIDAZOLAM HCL 10 MG/2ML IJ SOLN
INTRAMUSCULAR | Status: AC
  Filled 2013-07-20: qty 2

## 2013-07-20 MED ORDER — ACETAMINOPHEN 160 MG/5ML PO SOLN: 1000.0000 mg | Freq: Four times a day (QID) | ORAL | Status: DC

## 2013-07-20 MED ORDER — PANTOPRAZOLE SODIUM 40 MG PO TBEC
40.0000 mg | DELAYED_RELEASE_TABLET | Freq: Every day | ORAL | Status: DC
  Administered 2013-07-22 – 2013-07-26 (×5): 40 mg via ORAL
  Filled 2013-07-20 (×7): qty 1

## 2013-07-20 MED ORDER — DOCUSATE SODIUM 100 MG PO CAPS
200.0000 mg | ORAL_CAPSULE | Freq: Every day | ORAL | Status: DC
  Administered 2013-07-21 – 2013-07-26 (×6): 200 mg via ORAL
  Filled 2013-07-20 (×7): qty 2

## 2013-07-20 MED ORDER — SODIUM CHLORIDE 0.9 % IV SOLN
200.0000 ug | INTRAVENOUS | Status: DC | PRN
  Administered 2013-07-20: 0.2 ug/kg/h via INTRAVENOUS

## 2013-07-20 MED ORDER — METOPROLOL TARTRATE 1 MG/ML IV SOLN: 2.5000 mg | INTRAVENOUS | Status: DC | PRN

## 2013-07-20 MED ORDER — DEXTROSE 5 % IV SOLN
1.5000 g | Freq: Two times a day (BID) | INTRAVENOUS | Status: AC
  Administered 2013-07-21 – 2013-07-22 (×4): 1.5 g via INTRAVENOUS
  Filled 2013-07-20 (×4): qty 1.5

## 2013-07-20 MED ORDER — PHENYLEPHRINE HCL 10 MG/ML IJ SOLN: 30.0000 ug/min | INTRAVENOUS | Status: DC

## 2013-07-20 MED ORDER — SODIUM CHLORIDE 0.9 % IJ SOLN
INTRAMUSCULAR | Status: AC
  Filled 2013-07-20: qty 10

## 2013-07-20 MED ORDER — MIDAZOLAM HCL 2 MG/2ML IJ SOLN
2.0000 mg | INTRAMUSCULAR | Status: DC | PRN
  Filled 2013-07-20: qty 2

## 2013-07-20 MED ORDER — MIDAZOLAM HCL 5 MG/5ML IJ SOLN
INTRAMUSCULAR | Status: DC | PRN
  Administered 2013-07-20: 2 mg via INTRAVENOUS
  Administered 2013-07-20: 4 mg via INTRAVENOUS
  Administered 2013-07-20: 3 mg via INTRAVENOUS
  Administered 2013-07-20 (×5): 2 mg via INTRAVENOUS
  Administered 2013-07-20 (×3): 3 mg via INTRAVENOUS
  Administered 2013-07-20: 2 mg via INTRAVENOUS

## 2013-07-20 MED ORDER — ROCURONIUM BROMIDE 50 MG/5ML IV SOLN
INTRAVENOUS | Status: AC
  Filled 2013-07-20: qty 2

## 2013-07-20 MED ORDER — SODIUM CHLORIDE 0.9 % IV SOLN
10.0000 g | INTRAVENOUS | Status: DC | PRN
  Administered 2013-07-20: 5 g/h via INTRAVENOUS

## 2013-07-20 MED ORDER — POTASSIUM CHLORIDE 10 MEQ/50ML IV SOLN
10.0000 meq | INTRAVENOUS | Status: AC
  Administered 2013-07-20 (×3): 10 meq via INTRAVENOUS

## 2013-07-20 MED ORDER — ONDANSETRON HCL 4 MG/2ML IJ SOLN
4.0000 mg | Freq: Four times a day (QID) | INTRAMUSCULAR | Status: DC | PRN
  Administered 2013-07-21 – 2013-07-26 (×3): 4 mg via INTRAVENOUS
  Filled 2013-07-20 (×5): qty 2

## 2013-07-20 MED ORDER — LACTATED RINGERS IV SOLN: 500.0000 mL | Freq: Once | INTRAVENOUS | Status: AC | PRN

## 2013-07-20 MED ORDER — SODIUM CHLORIDE 0.9 % IV SOLN
INTRAVENOUS | Status: DC
  Administered 2013-07-20: 4.6 [IU]/h via INTRAVENOUS
  Filled 2013-07-20 (×2): qty 1

## 2013-07-20 MED ORDER — HEPARIN SODIUM (PORCINE) 1000 UNIT/ML IJ SOLN
INTRAMUSCULAR | Status: AC
  Filled 2013-07-20: qty 1

## 2013-07-20 MED ORDER — PROTAMINE SULFATE 10 MG/ML IV SOLN
INTRAVENOUS | Status: AC
  Filled 2013-07-20: qty 10

## 2013-07-20 MED ORDER — PROTAMINE SULFATE 10 MG/ML IV SOLN
INTRAVENOUS | Status: DC | PRN
Start: 2013-07-20 — End: 2013-07-20
  Administered 2013-07-20: 10 mg via INTRAVENOUS
  Administered 2013-07-20: 20 mg via INTRAVENOUS
  Administered 2013-07-20: 30 mg via INTRAVENOUS
  Administered 2013-07-20: 20 mg via INTRAVENOUS
  Administered 2013-07-20 (×2): 30 mg via INTRAVENOUS

## 2013-07-20 MED ORDER — METOPROLOL TARTRATE 12.5 MG HALF TABLET
12.5000 mg | ORAL_TABLET | Freq: Two times a day (BID) | ORAL | Status: DC
  Filled 2013-07-20 (×3): qty 1

## 2013-07-20 MED ORDER — FAMOTIDINE IN NACL 20-0.9 MG/50ML-% IV SOLN
20.0000 mg | Freq: Two times a day (BID) | INTRAVENOUS | Status: AC
  Administered 2013-07-20 (×2): 20 mg via INTRAVENOUS
  Filled 2013-07-20: qty 50

## 2013-07-20 MED ORDER — ACETAMINOPHEN 500 MG PO TABS
1000.0000 mg | ORAL_TABLET | Freq: Four times a day (QID) | ORAL | Status: AC
  Administered 2013-07-21 – 2013-07-25 (×14): 1000 mg via ORAL
  Filled 2013-07-20 (×20): qty 2

## 2013-07-20 MED ORDER — MORPHINE SULFATE 2 MG/ML IJ SOLN
1.0000 mg | INTRAMUSCULAR | Status: AC | PRN
  Administered 2013-07-20 – 2013-07-21 (×2): 2 mg via INTRAVENOUS
  Filled 2013-07-20: qty 1

## 2013-07-20 MED ORDER — ACETAMINOPHEN 160 MG/5ML PO SOLN
650.0000 mg | Freq: Once | ORAL | Status: AC
Start: 2013-07-20 — End: 2013-07-20
  Administered 2013-07-20: 650 mg

## 2013-07-20 MED ORDER — DEXMEDETOMIDINE HCL IN NACL 200 MCG/50ML IV SOLN
0.4000 ug/kg/h | INTRAVENOUS | Status: DC
  Filled 2013-07-20: qty 50

## 2013-07-20 MED ORDER — PHENYLEPHRINE HCL 10 MG/ML IJ SOLN
0.0000 ug/min | INTRAVENOUS | Status: DC
  Filled 2013-07-20: qty 2

## 2013-07-20 MED ORDER — MUPIROCIN 2 % EX OINT
TOPICAL_OINTMENT | Freq: Two times a day (BID) | CUTANEOUS | Status: DC
  Administered 2013-07-20 – 2013-07-23 (×6): via NASAL
  Administered 2013-07-23: 3 via NASAL
  Administered 2013-07-23: 13:00:00 via NASAL
  Administered 2013-07-25: 1 via NASAL
  Administered 2013-07-25 – 2013-07-26 (×2): via NASAL
  Filled 2013-07-20 (×2): qty 22

## 2013-07-20 MED ORDER — MAGNESIUM SULFATE 4000MG/100ML IJ SOLN
4.0000 g | Freq: Once | INTRAMUSCULAR | Status: AC
  Administered 2013-07-20: 4 g via INTRAVENOUS
  Filled 2013-07-20: qty 100

## 2013-07-20 MED ORDER — PHENYLEPHRINE HCL 10 MG/ML IJ SOLN
10.0000 mg | INTRAVENOUS | Status: DC | PRN
  Administered 2013-07-20: 40 ug/min via INTRAVENOUS

## 2013-07-20 MED ORDER — NITROGLYCERIN IN D5W 200-5 MCG/ML-% IV SOLN: 0.0000 ug/min | INTRAVENOUS | Status: DC

## 2013-07-20 MED ORDER — INSULIN REGULAR BOLUS VIA INFUSION
0.0000 [IU] | Freq: Three times a day (TID) | INTRAVENOUS | Status: DC
Start: 2013-07-20 — End: 2013-07-21
  Filled 2013-07-20: qty 10

## 2013-07-20 MED ORDER — LACTATED RINGERS IV SOLN
INTRAVENOUS | Status: DC | PRN
  Administered 2013-07-20 (×3): via INTRAVENOUS

## 2013-07-20 MED ORDER — SUCCINYLCHOLINE CHLORIDE 20 MG/ML IJ SOLN
INTRAMUSCULAR | Status: AC
  Filled 2013-07-20: qty 1

## 2013-07-20 MED ORDER — PROPOFOL 10 MG/ML IV BOLUS
INTRAVENOUS | Status: AC
  Filled 2013-07-20: qty 20

## 2013-07-20 MED ORDER — ROCURONIUM BROMIDE 100 MG/10ML IV SOLN
INTRAVENOUS | Status: DC | PRN
  Administered 2013-07-20: 40 mg via INTRAVENOUS
  Administered 2013-07-20: 50 mg via INTRAVENOUS
  Administered 2013-07-20: 60 mg via INTRAVENOUS
  Administered 2013-07-20: 50 mg via INTRAVENOUS

## 2013-07-20 MED ORDER — FENTANYL CITRATE 0.05 MG/ML IJ SOLN
INTRAMUSCULAR | Status: DC | PRN
  Administered 2013-07-20 (×5): 250 ug via INTRAVENOUS

## 2013-07-20 MED ORDER — LIDOCAINE HCL (CARDIAC) 20 MG/ML IV SOLN
INTRAVENOUS | Status: AC
  Filled 2013-07-20: qty 5

## 2013-07-20 MED ORDER — HEPARIN SODIUM (PORCINE) 1000 UNIT/ML IJ SOLN
INTRAMUSCULAR | Status: DC | PRN
  Administered 2013-07-20: 15 mL via INTRAVENOUS

## 2013-07-20 MED ORDER — ALBUMIN HUMAN 5 % IV SOLN
250.0000 mL | INTRAVENOUS | Status: AC | PRN
  Administered 2013-07-20 (×3): 250 mL via INTRAVENOUS
  Filled 2013-07-20 (×2): qty 250

## 2013-07-20 MED ORDER — SODIUM CHLORIDE 0.9 % IJ SOLN: 3.0000 mL | INTRAMUSCULAR | Status: DC | PRN

## 2013-07-20 MED ORDER — PHENYLEPHRINE HCL 10 MG/ML IJ SOLN
30.0000 ug/min | INTRAVENOUS | Status: DC
  Filled 2013-07-20: qty 2

## 2013-07-20 MED ORDER — SODIUM CHLORIDE 0.9 % IV SOLN
100.0000 [IU] | INTRAVENOUS | Status: DC | PRN
  Administered 2013-07-20: 5.4 [IU]/h via INTRAVENOUS

## 2013-07-20 MED ORDER — SODIUM CHLORIDE 0.9 % IJ SOLN
OROMUCOSAL | Status: DC | PRN
  Administered 2013-07-20 (×4): via TOPICAL

## 2013-07-20 MED ORDER — BISACODYL 10 MG RE SUPP: 10.0000 mg | Freq: Every day | RECTAL | Status: DC

## 2013-07-20 MED ORDER — VANCOMYCIN HCL IN DEXTROSE 1-5 GM/200ML-% IV SOLN
1000.0000 mg | Freq: Once | INTRAVENOUS | Status: AC
  Administered 2013-07-20: 1000 mg via INTRAVENOUS
  Filled 2013-07-20: qty 200

## 2013-07-20 MED ORDER — POTASSIUM CHLORIDE 10 MEQ/50ML IV SOLN
10.0000 meq | INTRAVENOUS | Status: AC
  Administered 2013-07-20 (×2): 10 meq via INTRAVENOUS

## 2013-07-20 MED ORDER — ROCURONIUM BROMIDE 50 MG/5ML IV SOLN
INTRAVENOUS | Status: AC
  Filled 2013-07-20: qty 3

## 2013-07-20 MED ORDER — DEXTROSE 5 % IV SOLN
30.0000 ug/min | INTRAVENOUS | Status: DC
  Filled 2013-07-20: qty 2

## 2013-07-20 MED ORDER — PHENYLEPHRINE HCL 10 MG/ML IJ SOLN
20.0000 mg | INTRAVENOUS | Status: DC | PRN
  Administered 2013-07-20: 13.3 ug/min via INTRAVENOUS

## 2013-07-20 MED ORDER — SODIUM CHLORIDE 0.9 % IV SOLN
250.0000 mL | INTRAVENOUS | Status: AC
  Administered 2013-07-20: 1000 mL via INTRAVENOUS

## 2013-07-20 MED ORDER — SODIUM CHLORIDE 0.9 % IJ SOLN
3.0000 mL | Freq: Two times a day (BID) | INTRAMUSCULAR | Status: DC
  Administered 2013-07-21 – 2013-07-23 (×5): 3 mL via INTRAVENOUS

## 2013-07-20 MED ORDER — BISACODYL 5 MG PO TBEC
10.0000 mg | DELAYED_RELEASE_TABLET | Freq: Every day | ORAL | Status: DC
Start: 2013-07-21 — End: 2013-07-26
  Administered 2013-07-21 – 2013-07-26 (×4): 10 mg via ORAL
  Filled 2013-07-20 (×4): qty 2

## 2013-07-20 MED ORDER — SODIUM CHLORIDE 0.45 % IV SOLN
INTRAVENOUS | Status: DC
  Administered 2013-07-20: 15:00:00 via INTRAVENOUS

## 2013-07-20 MED ORDER — ASPIRIN EC 325 MG PO TBEC
325.0000 mg | DELAYED_RELEASE_TABLET | Freq: Every day | ORAL | Status: DC
  Administered 2013-07-21 – 2013-07-23 (×3): 325 mg via ORAL
  Filled 2013-07-20 (×4): qty 1

## 2013-07-20 MED FILL — Mannitol IV Soln 20%: INTRAVENOUS | Qty: 500 | Status: AC

## 2013-07-20 MED FILL — Sodium Chloride IV Soln 0.9%: INTRAVENOUS | Qty: 2000 | Status: AC

## 2013-07-20 MED FILL — Sodium Chloride Irrigation Soln 0.9%: Qty: 1000 | Status: AC

## 2013-07-20 MED FILL — Sodium Bicarbonate IV Soln 8.4%: INTRAVENOUS | Qty: 50 | Status: AC

## 2013-07-20 MED FILL — Electrolyte-R (PH 7.4) Solution: INTRAVENOUS | Qty: 3000 | Status: AC

## 2013-07-20 MED FILL — Heparin Sodium (Porcine) Inj 1000 Unit/ML: INTRAMUSCULAR | Qty: 20 | Status: AC

## 2013-07-20 MED FILL — Lidocaine HCl IV Inj 20 MG/ML: INTRAVENOUS | Qty: 5 | Status: AC

## 2013-07-20 SURGICAL SUPPLY — 133 items
ADAPTER CARDIO PERF ANTE/RETRO (ADAPTER) ×3 IMPLANT
ANTEGRADE CPLG (MISCELLANEOUS) ×3 IMPLANT
APPLICATOR COTTON TIP 6IN STRL (MISCELLANEOUS) IMPLANT
ATTRACTOMAT 16X20 MAGNETIC DRP (DRAPES) ×3 IMPLANT
BAG DECANTER FOR FLEXI CONT (MISCELLANEOUS) ×6 IMPLANT
BIT DRILL 8MM (BIT) ×3 IMPLANT
BLADE 11 SAFETY STRL DISP (BLADE) ×3 IMPLANT
BLADE STERNUM SYSTEM 6 (BLADE) ×3 IMPLANT
BLADE SURG 11 STRL SS (BLADE) ×3 IMPLANT
BLADE SURG ROTATE 9660 (MISCELLANEOUS) IMPLANT
CANISTER SUCTION 2500CC (MISCELLANEOUS) ×3 IMPLANT
CANN PRFSN 3/8X14X24FR PCFC (MISCELLANEOUS)
CANN PRFSN 3/8XCNCT ST RT ANG (MISCELLANEOUS)
CANNULA AORTIC ROOT 20012 (MISCELLANEOUS) ×3 IMPLANT
CANNULA ARTERIAL NVNT 3/8 22FR (MISCELLANEOUS) IMPLANT
CANNULA BIO-MED ART (CANNULA) ×3 IMPLANT
CANNULA EZ GLIDE AORTIC 21FR (CANNULA) ×3 IMPLANT
CANNULA FEM VENOUS REMOTE 22FR (CANNULA) ×3 IMPLANT
CANNULA GUNDRY RCSP 15FR (MISCELLANEOUS) IMPLANT
CANNULA OPTISITE PERFUSION 16F (CANNULA) ×3 IMPLANT
CANNULA OPTISITE PERFUSION 18F (CANNULA) ×3 IMPLANT
CANNULA PRFSN 3/8X14X24FR PCFC (MISCELLANEOUS) IMPLANT
CANNULA PRFSN 3/8XCNCT RT ANG (MISCELLANEOUS) IMPLANT
CANNULA VEN MTL TIP RT (MISCELLANEOUS)
CARDIOBLATE CARDIAC ABLATION (MISCELLANEOUS)
CATH CPB KIT OWEN (MISCELLANEOUS) ×3 IMPLANT
CATH HEART VENT LEFT (CATHETERS) ×4 IMPLANT
CATH ROBINSON RED A/P 18FR (CATHETERS) ×15 IMPLANT
CATH THORACIC 28FR RT ANG (CATHETERS) IMPLANT
CATH THORACIC 36FR (CATHETERS) ×3 IMPLANT
CATH THORACIC 36FR RT ANG (CATHETERS) ×3 IMPLANT
CLEANER TIP ELECTROSURG 2X2 (MISCELLANEOUS) ×3 IMPLANT
CLIP FOGARTY SPRING 6M (CLIP) IMPLANT
CONN 1/2X1/2X1/2  BEN (MISCELLANEOUS) ×1
CONN 1/2X1/2X1/2 BEN (MISCELLANEOUS) ×2 IMPLANT
CONN 3/8X1/2 ST GISH (MISCELLANEOUS) ×6 IMPLANT
CONN ST 1/4X3/8  BEN (MISCELLANEOUS) ×1
CONN ST 1/4X3/8 BEN (MISCELLANEOUS) ×2 IMPLANT
COVER PROBE W GEL 5X96 (DRAPES) ×3 IMPLANT
COVER SURGICAL LIGHT HANDLE (MISCELLANEOUS) ×6 IMPLANT
CRADLE DONUT ADULT HEAD (MISCELLANEOUS) ×3 IMPLANT
DERMABOND ADVANCED (GAUZE/BANDAGES/DRESSINGS) ×1
DERMABOND ADVANCED .7 DNX12 (GAUZE/BANDAGES/DRESSINGS) ×2 IMPLANT
DEVICE CARDIOBLATE CARDIAC ABL (MISCELLANEOUS) IMPLANT
DEVICE PMI PUNCTURE CLOSURE (MISCELLANEOUS) ×3 IMPLANT
DRAIN CHANNEL 32F RND 10.7 FF (WOUND CARE) IMPLANT
DRAPE BILATERAL SPLIT (DRAPES) ×3 IMPLANT
DRAPE CARDIOVASCULAR INCISE (DRAPES) ×1
DRAPE CV SPLIT W-CLR ANES SCRN (DRAPES) ×3 IMPLANT
DRAPE INCISE IOBAN 66X45 STRL (DRAPES) ×3 IMPLANT
DRAPE SLUSH/WARMER DISC (DRAPES) ×3 IMPLANT
DRAPE SRG 135X102X78XABS (DRAPES) ×2 IMPLANT
DRSG COVADERM 4X14 (GAUZE/BANDAGES/DRESSINGS) ×3 IMPLANT
ELECT REM PT RETURN 9FT ADLT (ELECTROSURGICAL) ×6
ELECTRODE REM PT RTRN 9FT ADLT (ELECTROSURGICAL) ×4 IMPLANT
GLOVE BIO SURGEON STRL SZ 6 (GLOVE) ×18 IMPLANT
GLOVE BIO SURGEON STRL SZ 6.5 (GLOVE) ×3 IMPLANT
GLOVE BIO SURGEON STRL SZ7 (GLOVE) IMPLANT
GLOVE BIO SURGEON STRL SZ7.5 (GLOVE) IMPLANT
GLOVE BIOGEL PI IND STRL 6.5 (GLOVE) ×20 IMPLANT
GLOVE BIOGEL PI IND STRL 7.0 (GLOVE) ×8 IMPLANT
GLOVE BIOGEL PI INDICATOR 6.5 (GLOVE) ×10
GLOVE BIOGEL PI INDICATOR 7.0 (GLOVE) ×4
GLOVE ORTHO TXT STRL SZ7.5 (GLOVE) ×12 IMPLANT
GOWN STRL REUS W/ TWL LRG LVL3 (GOWN DISPOSABLE) ×8 IMPLANT
GOWN STRL REUS W/TWL LRG LVL3 (GOWN DISPOSABLE) ×4
GOWN STRL REUS W/TWL MED LVL3 (GOWN DISPOSABLE) ×24 IMPLANT
HEMOSTAT POWDER SURGIFOAM 1G (HEMOSTASIS) ×9 IMPLANT
INSERT FOGARTY XLG (MISCELLANEOUS) ×3 IMPLANT
KIT BASIN OR (CUSTOM PROCEDURE TRAY) ×3 IMPLANT
KIT DILATOR VASC 18G NDL (KITS) ×6 IMPLANT
KIT DRAINAGE VACCUM ASSIST (KITS) ×3 IMPLANT
KIT ROOM TURNOVER OR (KITS) ×3 IMPLANT
KIT SUCTION CATH 14FR (SUCTIONS) ×3 IMPLANT
LEAD PACING MYOCARDI (MISCELLANEOUS) ×3 IMPLANT
LINE VENT (MISCELLANEOUS) ×3 IMPLANT
LOOP VESSEL SUPERMAXI WHITE (MISCELLANEOUS) ×3 IMPLANT
MARKER GRAFT CORONARY BYPASS (MISCELLANEOUS) IMPLANT
NS IRRIG 1000ML POUR BTL (IV SOLUTION) ×24 IMPLANT
PACK OPEN HEART (CUSTOM PROCEDURE TRAY) ×3 IMPLANT
PAD ARMBOARD 7.5X6 YLW CONV (MISCELLANEOUS) ×6 IMPLANT
RING TRICUSPID T28 (Prosthesis & Implant Heart) ×3 IMPLANT
SET CANNULATION TOURNIQUET (MISCELLANEOUS) ×3 IMPLANT
SET CARDIOPLEGIA MPS 5001102 (MISCELLANEOUS) ×3 IMPLANT
SOLUTION ANTI FOG 6CC (MISCELLANEOUS) ×3 IMPLANT
SPONGE GAUZE 4X4 12PLY (GAUZE/BANDAGES/DRESSINGS) ×3 IMPLANT
SPONGE LAP 4X18 X RAY DECT (DISPOSABLE) ×6 IMPLANT
SUCKER INTRACARDIAC WEIGHTED (SUCKER) ×9 IMPLANT
SUT BONE WAX W31G (SUTURE) ×3 IMPLANT
SUT ETHIBOND (SUTURE) ×12 IMPLANT
SUT ETHIBOND 2 0 SH (SUTURE) ×5 IMPLANT
SUT ETHIBOND 2 0 SH 36X2 (SUTURE) ×4 IMPLANT
SUT ETHIBOND 2 0 V4 (SUTURE) IMPLANT
SUT ETHIBOND 2 0V4 GREEN (SUTURE) IMPLANT
SUT ETHIBOND 4 0 TF (SUTURE) IMPLANT
SUT ETHIBOND 5 0 C 1 30 (SUTURE) ×3 IMPLANT
SUT ETHIBOND X763 2 0 SH 1 (SUTURE) ×12 IMPLANT
SUT GORETEX CV 4 TH 22 36 (SUTURE) ×3 IMPLANT
SUT GORETEX CV4 TH-18 (SUTURE) ×6 IMPLANT
SUT MNCRL AB 3-0 PS2 18 (SUTURE) ×9 IMPLANT
SUT MNCRL AB 4-0 PS2 18 (SUTURE) ×6 IMPLANT
SUT PDS AB 1 CTX 36 (SUTURE) ×6 IMPLANT
SUT PROLENE 3 0 SH 1 (SUTURE) ×3 IMPLANT
SUT PROLENE 3 0 SH DA (SUTURE) ×6 IMPLANT
SUT PROLENE 3 0 SH1 36 (SUTURE) ×21 IMPLANT
SUT PROLENE 4 0 RB 1 (SUTURE) ×10
SUT PROLENE 4 0 SH DA (SUTURE) ×6 IMPLANT
SUT PROLENE 4-0 RB1 .5 CRCL 36 (SUTURE) ×20 IMPLANT
SUT PROLENE 5 0 C1 (SUTURE) ×6 IMPLANT
SUT PROLENE 5 0 CC1 (SUTURE) IMPLANT
SUT PROLENE 6 0 C 1 30 (SUTURE) ×6 IMPLANT
SUT SILK  1 MH (SUTURE) ×3
SUT SILK 1 MH (SUTURE) ×6 IMPLANT
SUT SILK 2 0 TIES 10X30 (SUTURE) ×3 IMPLANT
SUT STEEL 6MS V (SUTURE) IMPLANT
SUT STEEL STERNAL CCS#1 18IN (SUTURE) IMPLANT
SUT STEEL SZ 6 DBL 3X14 BALL (SUTURE) IMPLANT
SUT VIC AB 2-0 CTX 27 (SUTURE) IMPLANT
SUT VIC AB 3-0 SH 18 (SUTURE) ×6 IMPLANT
SUT VIC AB 3-0 SH 8-18 (SUTURE) ×3 IMPLANT
SUT VIC AB 3-0 X1 27 (SUTURE) IMPLANT
SUTURE E-PAK OPEN HEART (SUTURE) ×3 IMPLANT
SYSTEM SAHARA CHEST DRAIN ATS (WOUND CARE) ×3 IMPLANT
TOWEL OR 17X24 6PK STRL BLUE (TOWEL DISPOSABLE) ×3 IMPLANT
TOWEL OR 17X26 10 PK STRL BLUE (TOWEL DISPOSABLE) ×6 IMPLANT
TRAY FOLEY IC TEMP SENS 14FR (CATHETERS) ×3 IMPLANT
TUBE SUCT INTRACARD DLP 20F (MISCELLANEOUS) ×3 IMPLANT
TUBING INSUFFLATION 10FT LAP (TUBING) ×3 IMPLANT
UNDERPAD 30X30 INCONTINENT (UNDERPADS AND DIAPERS) ×3 IMPLANT
VALVE CARDIOGRAFT LRG 26X4X1.2 (Valve) ×3 IMPLANT
VENT LEFT HEART 12002 (CATHETERS) ×6
WATER STERILE IRR 1000ML POUR (IV SOLUTION) ×6 IMPLANT
WIRE BENTSON .035X145CM (WIRE) ×3 IMPLANT

## 2013-07-20 NOTE — Brief Op Note (Addendum)
07/20/2013  11:45 AM  PATIENT:  [name]  38 y.o. male  PRE-OPERATIVE DIAGNOSIS:  1.RECURRENT ATRIAL SEPTAL DEFECT 2. TRICUSPID REGURGITATION  POST-OPERATIVE DIAGNOSIS:   1.RECURRENT ATRIAL SEPTAL DEFECT 2.TRICUSPID REGURGITATION  PROCEDURE:  REDO PATCH CLOSURE OF ATRIAL SEPTAL DEFECT (ASD), and TRICUSPID VALVE REPAIR (using an Annuloplasty ring, size 28 mm)  SURGEON:    Purcell Nailslarence H Owen, MD  ASSISTANTS:  Ardelle Ballsonielle M. 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   Etiology (Choose at least one and up to five): Fuctional.  Mitral/Tricuspid/Pulmonary Valve Procedure  Mitral Valve Procedure Performed:  N/A  Implant: N/A  Tricuspid Valve Procedure Performed:  Annuloplasty only.  Implant: Annuloplasty Device; Implant Model Number 4900, Size 28 mm, Unique Device Identifier [id number]  Pulmonary Valve Procedure Performed: N/A  Implant: N/A      COMPLICATIONS: None  BASELINE WEIGHT: 79.8 kg  PATIENT DISPOSITION:   TO SICU IN STABLE CONDITION  OWEN,CLARENCE H 07/20/2013 2:19 PM

## 2013-07-20 NOTE — Anesthesia Procedure Notes (Signed)
Procedure Name: Intubation Date/Time: 07/20/2013 7:56 AM Performed by: Rogelia BogaMUELLER, Karmin Kasprzak P Pre-anesthesia Checklist: Patient identified, Emergency Drugs available, Suction available, Patient being monitored and Timeout performed Patient Re-evaluated:Patient Re-evaluated prior to inductionOxygen Delivery Method: Circle system utilized Preoxygenation: Pre-oxygenation with 100% oxygen Intubation Type: IV induction Ventilation: Mask ventilation without difficulty and Oral airway inserted - appropriate to patient size Laryngoscope Size: Mac and 4 Grade View: Grade II Tube type: Oral Tube size: 8.0 mm Number of attempts: 1 Airway Equipment and Method: Stylet Placement Confirmation: ETT inserted through vocal cords under direct vision,  positive ETCO2 and breath sounds checked- equal and bilateral Secured at: 22 cm Tube secured with: Tape Dental Injury: Teeth and Oropharynx as per pre-operative assessment

## 2013-07-20 NOTE — Progress Notes (Signed)
Utilization Review Completed.[name] T3/26/2015  

## 2013-07-20 NOTE — Op Note (Signed)
CARDIOTHORACIC SURGERY OPERATIVE NOTE  Date of Procedure:  07/20/2013  Preoperative Diagnosis:   Recurrent or persistent atrial septal defect  Severe tricuspid regurgitation  Partially anomalous pulmonary venous return  Postoperative Diagnosis: Same  Procedure:    Redo Patch Closure of Atrial Septal Defect    Tricuspid Valve Repair  Edwards MC3 ring annuloplasty (size 28mm, model #4900, serial #[id number])  Surgeon: Salvatore Decent. Cornelius Moras, MD  Assistant: Ardelle Balls, PA-C  Anesthesia: Sharee Holster, MD  Operative Findings: Large persistent or recurrent atrial septal defect  Severe RV chamber enlargement  Type I tricuspid valve dysfunction with severe tricuspid regurgitation  Partially anomalous pulmonary venous return with portion of RUL drainage into SVC 4.5 cm above SVC-RA juction  No residual tricuspid regurgitation after successful tricuspid valve repair  No significant residual left to right shunt                BRIEF CLINICAL NOTE AND INDICATIONS FOR SURGERY  Patient is a 38 year old Hispanic male with history of congenital heart disease who underwent pericardial patch repair of what was reported to be a secundum type atrial septal defect by Dr. Buford Dresser at [facility] in [place] in 1999. The patient and his wife recall that he was initially discharged from the hospital within 4 or 5 days after surgery, but he was shortly thereafter readmitted with respiratory failure. He was hospitalized for nearly 3 months during which time he underwent left thoracotomy for lung biopsy. He was told that he had pneumonia and significant liver dysfunction. 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 The patient states that he first began to experience worsening exertional shortness of breath over 6 months ago. He began to fatigue easily and ultimately lost his job 4 months ago because he could not keep pace. Symptoms  continued to progress until the patient was eventually hospitalized at [facility] in December with shortness of breath and atypical chest pain. An echocardiogram demonstrated an enlarged right heart with left to right flow across the interatrial septum. A TEE was done and this confirmed a large ASD. He was referred to Dr. Excell Seltzer took him to the cath lab for possible closure. However, the patient was found to have anatomical characteristics unfavorable for percutaneous closure. A cardiac gated CT angiogram of the heart was performed to better characterize the anatomy and the patient has been referred for possible surgical intervention.  The patient has been seen in consultation with an interpreter present.  We was counseled at length regarding the indications, risks and potential benefits of surgery.  All questions have been answered, and the patient provides full informed consent for the operation as described.     DETAILS OF THE OPERATIVE PROCEDURE  Preparation:  The patient is brought to the operating room on the above mentioned date and central monitoring was established by the anesthesia team including placement of left internal jugular and left subclavian central venous catheters and radial arterial line. The patient is placed in the supine position on the operating table.  Intravenous antibiotics are administered. General endotracheal anesthesia is induced uneventfully. A Foley catheter is placed.  Baseline transesophageal echocardiogram was performed.  Findings were notable for normal left ventricular size and systolic function. There is trace mitral regurgitation. There is severe enlargement of the right ventricle with severe enlargement of the tricuspid annulus. There is type I dysfunction of the tricuspid valve with severe tricuspid regurgitation. There is an obvious defect in the intra-atrial septum immediately superior to  the coronary sinus. No other significant abnormalities are noted.  The  patient's chest, abdomen, both groins, and both lower extremities are prepared and draped in a sterile manner. A time out procedure is performed.   Surgical Approach:  Redo median sternotomy incision was performed and all of the old sternal wires were removed. The sternum is divided using an oscillating saw. Sternal reentry is uneventful. Electrocautery and sharp dissection are utilized to separate the posterior surface of the sternum from the anterior surface of the heart and paracardium.  A retractor is placed.  Dissection is continued anteriorly to expose the anterior surface of the aorta. There are dense adhesions from the patient's previous surgery.  There is extreme enlargement of the right ventricle and pulmonary artery such that exposure of the aorta is very poor.   Extracorporeal Cardiopulmonary Bypass and Myocardial Protection:  A small oblique incision is made in the right inguinal crease. The anterior surface the right common femoral artery and right common femoral vein are identified. Pursestring sutures are placed in the anterior surface of the right common femoral artery and right common femoral vein. The right internal jugular vein is cannulated using a Seldinger technique and a guidewire advanced into the right atrium. The patient is heparinized systemically.  The right common femoral vein is cannulated using the Seldinger technique and a guidewire advanced into the right atrium using TEE guidance.  The right common femoral vein cannulated using a 22 Fr long femoral venous cannula.  The right common femoral artery is cannulated using the Seldinger technique and a guidewire advanced into the descending thoracic aorta using TEE guidance.  The right common femoral artery is cannulated using a 16 French femoral arterial cannula.  The right internal jugular vein is cannulated using a 15 [place] pediatric femoral venous cannula.  Adequate heparinization is verified.     The entire pre-bypass  portion of the operation was notable for stable hemodynamics.  Cardiopulmonary bypass was begun.  Sharp dissection was utilized to dissect the right ventricle and right atrium away from adjacent structures. The rim of the pericardium is identified. The medial surface of the right atrium was dissected away from the ascending thoracic aorta. The ascending thoracic aorta is dissected away from the pulmonary artery.  An antegrade cardioplegia cannula is placed directly in the ascending thoracic aorta. Dissection is continued laterally to identify the intra-atrial groove. An umbilical tape is placed around the inferior vena cava.   Dissection is continued superiorly and the superior vena cava is encircled. Initially there does not appear to be any sign of anomalous pulmonary venous drainage.the right pulmonary artery is identified. There appeared to be 2 large separate pulmonary veins draining into the left atrium. After extended dissection in a cephalad direction on further inspection there is what may be a relatively small anomalous pulmonary vein draining into the superior vena cava very high, approximately 4.5 cm above the superior vena cava right atrial junction.  Because of the relatively small size of the vein and the position so far removed from the junction between the superior vena cava and the right atrium, a decision is made not to attempt to baffle this venous flow into the left atrium.  An umbilical tape is placed around the superior vena cava.  The operative field is continuously flooded with carbon dioxide.  The patient is allowed to cool passively to [facility] systemic temperature.  The aortic cross clamp is applied and cold blood cardioplegia is delivered in an antegrade fashion through the  aortic root.   Iced saline slush is applied for topical hypothermia.  The initial cardioplegic arrest is rapid with early diastolic arrest.  Repeat doses of cardioplegia are administered intermittently throughout  the entire cross clamp portion of the operation through the aortic root in order to maintain completely flat electrocardiogram and septal myocardial temperature below 15C.  Myocardial protection was felt to be excellent.   Redo Patch Closure of Atrial Septal Defect:  The umbilical tapes are secured around the superior and inferior vena cava. A lateral incision is made in the right atrium immediately anterior to the intra-atrial groove. The incision is continued in a cephalad direction 2 cm above the junction between the superior vena cava and the right atrium. Inferiorly the incision is curved along the anterior wall of the right atrium towards the acute margin of the heart. The interior of the right atrium is inspected. There is an obvious 2.5 cm oval shaped atrial septal defect immediately cephalad to the orifice of the inferior vena cava and coronary sinus. The previous patch closure is clearly appreciated with incomplete closure of what presumably was previously a very large defect. There is no inferior rim of the septum along a fair border of the persistent/recurrent septal defect. The eustachian valve over the orifice of the inferior vena cava is immediately adjacent. Is impossible to tell whether or not a residual defect was left behind or some sutures from the original closure pulled through the inferior wall. However, the anterior wall appears smooth and without any signs of previous trauma. The patch itself otherwise appeared intact. The orifice of the right sided pulmonary veins draining into the left atrium are examined through the atrial septal defect. It appears that the majority of the right-sided pulmonary veins drained normally with exception of the small anomalous veins noted to be draining high up into the superior vena cava as noted previously. A left ventricular vent was placed through the right superior pulmonary vein across the mitral valve. A portion of pulmonary homograft tissue is  trimmed to an elliptical shape to be utilized for patch closure of atrial septal defect. The defect is closed using this patch with running 4-0 Prolene suture.   Tricuspid Valve Repair:  The tricuspid valve is inspected carefully. The tricuspid valve leaflets appear normal with normal mobility and no sign of any fibrosis or thickening. Ring annuloplasty is performed using interrupted 2-0 Ethibond horizontal mattress sutures placed circumferentially around the tricuspid annulus with exception of the area immediately below the triangle of Koch. The valve was sized to accept a 28 mm annuloplasty ring based upon the overall surface area of the combined anterior and posterior leaflets. An Edwards [facility] 3 annuloplasty ring (size 28 mm, model #4900, serial #[id number]) is implanted uneventfully. After completion of the annuloplasty the valve was tested with saline and appears to be competent. Rewarming is begun.     Procedure Completion:  One final dose of warm retrograde "hot shot" cardioplegia was administered retrograde through the coronary sinus catheter while all air was evacuated through the aortic root.  The aortic cross clamp was removed after a total cross clamp time of 63 minutes.  The right atriotomy incision is closed using a 2 layer closure of running 4-0 Prolene suture.  Epicardial pacing wires are fixed to the right ventricular outflow tract and to the right atrial appendage. The patient is rewarmed to 37C temperature. The aortic and left ventricular vents are removed.  The patient is weaned and disconnected from cardiopulmonary bypass.  The patient's rhythm at separation from bypass was atrial paced with a slow junctional rhythm underneath.  The patient was weaned from cardioplegic bypass without any inotropic support. Total cardiopulmonary bypass time for the operation was 159 minutes.  Followup transesophageal echocardiogram performed after separation from bypass revealed an intact interatrial  septum with no sign of residual atrial septal defect.  There was a well-seated tricuspid annuloplasty ring and a tricuspid valve that was functioning normally and without any residual tricuspid regurgitation.  Left ventricular function was unchanged from preoperatively.  The right ventricular size was considerably smaller than preoperatively and there appeared to be normal right ventricular function.   The right internal jugular venous cannula was required with a guidewire and removed. A Cordis introducer sheath was advanced over the guidewire. A Swan-Ganz pulmonary artery catheter was advanced through the Cordis introducing sheath and floated through the tricuspid valve into the pulmonary artery.  Both the pulmonary artery and central venous pressures were normal.  Oxygen saturation sample obtained from the superior vena cava through the proximal infusion port of the Swan-Ganz catheter was 66%. Near simultaneous mixed venous oxygen saturation obtained from the pulmonary artery distal port was 59%. Thermodilution cardiac output ranged between 4.9 and 5.5 L per minute.  The right femoral arterial and venous canulae were removed uneventfully and their pursestring sutures were secured. There was a palpable pulse in the distal right femoral artery. Protamine was administered to reverse the anticoagulation.  The mediastinum and pleural space were inspected for hemostasis and irrigated with saline solution. The mediastinum was drained using 2 chest tubes placed through separate stab incisions inferiorly.  The soft tissues anterior to the aorta were reapproximated loosely. The sternum is closed with double strength sternal wire. The soft tissues anterior to the sternum were closed in multiple layers and the skin is closed with a running subcuticular skin closure.   The post-bypass portion of the operation was notable for stable rhythm and hemodynamics.   No blood products were administered during the  operation.   Disposition:  The patient tolerated the procedure well.  The patient was transported to the surgical intensive care unit in stable condition. There were no intraoperative complications. All sponge instrument and needle counts are verified correct at completion of the operation.    Salvatore Decentlarence H. Cornelius Moraswen MD 07/20/2013 2:26 PM

## 2013-07-20 NOTE — Preoperative (Signed)
Beta Blockers   Reason not to administer Beta Blockers:Not Applicable, Metoprolol given at [id number]

## 2013-07-20 NOTE — Progress Notes (Signed)
EKG CRITICAL VALUE     12 lead EKG performed.  Critical value noted. Luther ParodyKryestyn Matherly, RN notified.   Ernestina PennaROBERTS, Cortlyn Cannell M, CCT 07/20/2013 3:15 PM

## 2013-07-20 NOTE — Transfer of Care (Signed)
Immediate Anesthesia Transfer of Care Note  Patient: Terry HardyGerardo Loma-Emiliano  Procedure(s) Performed: Procedure(s): REDO CLOSURE OF ATRIAL SEPTAL DEFECT (ASD) (N/A) TRICUSPID VALVE REPAIR (N/A) INTRAOPERATIVE TRANSESOPHAGEAL ECHOCARDIOGRAM (N/A)  Patient Location: SICU  Anesthesia Type:General  Level of Consciousness: sedated and Patient remains intubated per anesthesia plan  Airway & Oxygen Therapy: Patient remains intubated per anesthesia plan and Patient placed on Ventilator (see vital sign flow sheet for setting)  Post-op Assessment: Post -op Vital signs reviewed and stable and Report Given to Levan HurstKrysten Matherly, RN  Post vital signs: Reviewed and stable  Complications: No apparent anesthesia complications

## 2013-07-20 NOTE — Procedures (Signed)
Extubation Procedure Note  Patient Details:   Name: Terry HardyGerardo Loma-Emiliano DOB: August 13, 1975 MRN: [id number]   Airway Documentation:     Evaluation  O2 sats: stable throughout and currently acceptable Complications: No apparent complications Patient did tolerate procedure well. Bilateral Breath Sounds: Clear   Yes  Extubated patient to 4 lpm  at this time. Patient had positive cuff leak and was suctioned before extubation. Patient's NIF was -35 and VC over 1 L. Acceptable ABG prior to extubation. No stridor noted and no apparent complications. RT will continue to assist as needed.  Tyna JakschColeman, Tanelle Lanzo N 07/20/2013, 10:52 PM

## 2013-07-20 NOTE — Progress Notes (Signed)
T[facility] BRIEF SICU PROGRESS NOTE  Day of Surgery  S/P Procedure(s) (LRB): REDO CLOSURE OF ATRIAL SEPTAL DEFECT (ASD) (N/A) TRICUSPID VALVE REPAIR (N/A) INTRAOPERATIVE TRANSESOPHAGEAL ECHOCARDIOGRAM (N/A)   Sedated on vent AAI paced w/ stable hemodynamics, no drips Chest tube output low Excellent UOP Labs okay except platelet count 85k, INR 1.7 but not bleeding CXR w/ tiny right PTX, stable on repeat  Plan: Continue routine early postop  [name] H 07/20/2013 8:22 PM

## 2013-07-20 NOTE — Anesthesia Postprocedure Evaluation (Signed)
  Anesthesia Post-op Note  Patient: [name]  Procedure(s) Performed: Procedure(s): REDO CLOSURE OF ATRIAL SEPTAL DEFECT (ASD) (N/A) TRICUSPID VALVE REPAIR (N/A) INTRAOPERATIVE TRANSESOPHAGEAL ECHOCARDIOGRAM (N/A)  Patient Location: SICU  Anesthesia Type:General  Level of Consciousness: sedated and Patient remains intubated per anesthesia plan  Airway and Oxygen Therapy: Patient remains intubated per anesthesia plan and Patient placed on Ventilator (see vital sign flow sheet for setting)  Post-op Pain: none  Post-op Assessment: Post-op Vital signs reviewed, Patient's Cardiovascular Status Stable and Respiratory Function Stable  Post-op Vital Signs: stable  Complications: No apparent anesthesia complications

## 2013-07-20 NOTE — Interval H&P Note (Signed)
History and Physical Interval Note:  07/20/2013 7:35 AM  [name]  has presented today for surgery, with the diagnosis of RECURRENT ATRIAL SEPTAL DEFECT ANOMALOUS PULMONARY VENOUS RETURN TRICUSPID REGURGITATION  The various methods of treatment have been discussed with the patient and family. After consideration of risks, benefits and other options for treatment, the patient has consented to  Procedure(s) with comments: REDO CLOSURE OF ATRIAL SEPTAL DEFECT (ASD) (N/A) - REPAIR ANOMALOUS PULMONARY VENOUS RETURN  PREOP NOTE FOR ANESTHESIA: NO NECK LINES ON RIGHT, NO SWAN-GANZ CATHETER TRICUSPID VALVE REPAIR (N/A) INTRAOPERATIVE TRANSESOPHAGEAL ECHOCARDIOGRAM (N/A) as a surgical intervention .  The patient's history has been reviewed, patient examined, no change in status, stable for surgery.  I have reviewed the patient's chart and labs.  Questions were answered to the patient's satisfaction.     Ceriah Kohler H

## 2013-07-21 ENCOUNTER — Inpatient Hospital Stay (HOSPITAL_COMMUNITY): Payer: Medicaid Other

## 2013-07-21 LAB — GLUCOSE, CAPILLARY
GLUCOSE-CAPILLARY: 152 mg/dL — AB (ref 70–99)
GLUCOSE-CAPILLARY: 126 mg/dL — AB (ref 70–99)
GLUCOSE-CAPILLARY: 130 mg/dL — AB (ref 70–99)
GLUCOSE-CAPILLARY: 124 mg/dL — AB (ref 70–99)
GLUCOSE-CAPILLARY: 118 mg/dL — AB (ref 70–99)
GLUCOSE-CAPILLARY: 113 mg/dL — AB (ref 70–99)
Glucose-Capillary: 160 mg/dL — ABNORMAL HIGH (ref 70–99)
Glucose-Capillary: 126 mg/dL — ABNORMAL HIGH (ref 70–99)
Glucose-Capillary: 122 mg/dL — ABNORMAL HIGH (ref 70–99)
Glucose-Capillary: 131 mg/dL — ABNORMAL HIGH (ref 70–99)
Glucose-Capillary: 117 mg/dL — ABNORMAL HIGH (ref 70–99)
Glucose-Capillary: 109 mg/dL — ABNORMAL HIGH (ref 70–99)
Glucose-Capillary: 115 mg/dL — ABNORMAL HIGH (ref 70–99)
Glucose-Capillary: 107 mg/dL — ABNORMAL HIGH (ref 70–99)
Glucose-Capillary: 103 mg/dL — ABNORMAL HIGH (ref 70–99)
Glucose-Capillary: 108 mg/dL — ABNORMAL HIGH (ref 70–99)
Glucose-Capillary: 148 mg/dL — ABNORMAL HIGH (ref 70–99)
Glucose-Capillary: 267 mg/dL — ABNORMAL HIGH (ref 70–99)

## 2013-07-21 LAB — POCT I-STAT 3, VENOUS BLOOD GAS (G3P V)
ACID-BASE DEFICIT: 3 mmol/L — AB (ref 0.0–2.0)
Acid-base deficit: 3 mmol/L — ABNORMAL HIGH (ref 0.0–2.0)
BICARBONATE: 23.7 meq/L (ref 20.0–24.0)
Bicarbonate: 23.9 mEq/L (ref 20.0–24.0)
O2 Saturation: 59 %
O2 Saturation: 66 %
PH VEN: 7.31 — AB (ref 7.250–7.300)
PH VEN: 7.31 — AB (ref 7.250–7.300)
PO2 VEN: 34 mmHg (ref 30.0–45.0)
TCO2: 25 mmol/L (ref 0–100)
TCO2: 25 mmol/L (ref 0–100)
pCO2, Ven: 47.1 mmHg (ref 45.0–50.0)
pCO2, Ven: 47.5 mmHg (ref 45.0–50.0)
pO2, Ven: 38 mmHg (ref 30.0–45.0)

## 2013-07-21 LAB — CREATININE, SERUM
CREATININE: 0.6 mg/dL (ref 0.50–1.35)
GFR calc Af Amer: >90 mL/min (ref >90)

## 2013-07-21 LAB — CBC
HCT: 31.7 % — ABNORMAL LOW (ref 39.0–52.0)
HCT: 34.7 % — ABNORMAL LOW (ref 39.0–52.0)
Hemoglobin: 11.4 g/dL — ABNORMAL LOW (ref 13.0–17.0)
Hemoglobin: 12.5 g/dL — ABNORMAL LOW (ref 13.0–17.0)
MCH: 32.9 pg (ref 26.0–34.0)
MCH: 33.2 pg (ref 26.0–34.0)
MCHC: 36 g/dL (ref 30.0–36.0)
MCHC: 36 g/dL (ref 30.0–36.0)
MCV: 91.4 fL (ref 78.0–100.0)
MCV: 92.3 fL (ref 78.0–100.0)
PLATELETS: 91 10*3/uL — AB (ref 150–400)
PLATELETS: 91 10*3/uL — AB (ref 150–400)
RBC: 3.47 MIL/uL — ABNORMAL LOW (ref 4.22–5.81)
RBC: 3.76 MIL/uL — ABNORMAL LOW (ref 4.22–5.81)
RDW: 12.5 % (ref 11.5–15.5)
RDW: 12.7 % (ref 11.5–15.5)
WBC: 9.5 10*3/uL (ref 4.0–10.5)
WBC: 11.8 10*3/uL — ABNORMAL HIGH (ref 4.0–10.5)

## 2013-07-21 LAB — POCT I-STAT 3, ART BLOOD GAS (G3+)
ACID-BASE DEFICIT: 3 mmol/L — AB (ref 0.0–2.0)
Acid-base deficit: 3 mmol/L — ABNORMAL HIGH (ref 0.0–2.0)
Bicarbonate: 22.4 mEq/L (ref 20.0–24.0)
Bicarbonate: 22.6 mEq/L (ref 20.0–24.0)
O2 Saturation: 96 %
O2 Saturation: 97 %
PH ART: 7.34 — AB (ref 7.350–7.450)
PH ART: 7.37 (ref 7.350–7.450)
Patient temperature: 37.1
TCO2: 24 mmol/L (ref 0–100)
TCO2: 24 mmol/L (ref 0–100)
pCO2 arterial: 38.8 mmHg (ref 35.0–45.0)
pCO2 arterial: 41.9 mmHg (ref 35.0–45.0)
pO2, Arterial: 84 mmHg (ref 80.0–100.0)
pO2, Arterial: 95 mmHg (ref 80.0–100.0)

## 2013-07-21 LAB — BASIC METABOLIC PANEL
BUN: 8 mg/dL (ref 6–23)
CALCIUM: 7.5 mg/dL — AB (ref 8.4–10.5)
CO2: 23 meq/L (ref 19–32)
CREATININE: 0.52 mg/dL (ref 0.50–1.35)
Chloride: 102 mEq/L (ref 96–112)
GFR calc Af Amer: >90 mL/min (ref >90)
GFR calc non Af Amer: >90 mL/min (ref >90)
Glucose, Bld: 128 mg/dL — ABNORMAL HIGH (ref 70–99)
Potassium: 3.9 mEq/L (ref 3.7–5.3)
Sodium: 136 mEq/L — ABNORMAL LOW (ref 137–147)

## 2013-07-21 LAB — POCT I-STAT, CHEM 8
BUN: 4 mg/dL — ABNORMAL LOW (ref 6–23)
BUN: 6 mg/dL (ref 6–23)
CALCIUM ION: 1.1 mmol/L — AB (ref 1.12–1.23)
CALCIUM ION: 1.14 mmol/L (ref 1.12–1.23)
CHLORIDE: 102 meq/L (ref 96–112)
Chloride: 94 mEq/L — ABNORMAL LOW (ref 96–112)
Creatinine, Ser: 0.6 mg/dL (ref 0.50–1.35)
Creatinine, Ser: 0.8 mg/dL (ref 0.50–1.35)
GLUCOSE: 169 mg/dL — AB (ref 70–99)
Glucose, Bld: 133 mg/dL — ABNORMAL HIGH (ref 70–99)
HCT: 37 % — ABNORMAL LOW (ref 39.0–52.0)
HEMATOCRIT: 36 % — AB (ref 39.0–52.0)
HEMOGLOBIN: 12.2 g/dL — AB (ref 13.0–17.0)
HEMOGLOBIN: 12.6 g/dL — AB (ref 13.0–17.0)
Potassium: 4.1 mEq/L (ref 3.7–5.3)
Potassium: 4.5 mEq/L (ref 3.7–5.3)
Sodium: 139 mEq/L (ref 137–147)
Sodium: 132 mEq/L — ABNORMAL LOW (ref 137–147)
TCO2: 22 mmol/L (ref 0–100)
TCO2: 26 mmol/L (ref 0–100)

## 2013-07-21 LAB — MAGNESIUM
MAGNESIUM: 1.9 mg/dL (ref 1.5–2.5)
Magnesium: 1.8 mg/dL (ref 1.5–2.5)

## 2013-07-21 MED ORDER — INSULIN DETEMIR 100 UNIT/ML ~~LOC~~ SOLN
20.0000 [IU] | Freq: Two times a day (BID) | SUBCUTANEOUS | Status: DC
  Administered 2013-07-21 – 2013-07-26 (×11): 20 [IU] via SUBCUTANEOUS
  Filled 2013-07-21 (×12): qty 0.2

## 2013-07-21 MED ORDER — INSULIN ASPART 100 UNIT/ML ~~LOC~~ SOLN
0.0000 [IU] | SUBCUTANEOUS | Status: DC
  Administered 2013-07-21: 12 [IU] via SUBCUTANEOUS
  Administered 2013-07-21: 2 [IU] via SUBCUTANEOUS
  Administered 2013-07-22 (×2): 4 [IU] via SUBCUTANEOUS
  Administered 2013-07-22 (×2): 2 [IU] via SUBCUTANEOUS
  Administered 2013-07-22: 4 [IU] via SUBCUTANEOUS
  Administered 2013-07-22: 12 [IU] via SUBCUTANEOUS
  Administered 2013-07-23: 4 [IU] via SUBCUTANEOUS
  Administered 2013-07-23: 2 [IU] via SUBCUTANEOUS

## 2013-07-21 MED ORDER — POTASSIUM CHLORIDE 10 MEQ/50ML IV SOLN
10.0000 meq | INTRAVENOUS | Status: AC
  Administered 2013-07-21 (×3): 10 meq via INTRAVENOUS
  Filled 2013-07-21: qty 50

## 2013-07-21 MED ORDER — FUROSEMIDE 10 MG/ML IJ SOLN
20.0000 mg | Freq: Four times a day (QID) | INTRAMUSCULAR | Status: AC
  Administered 2013-07-21 (×3): 20 mg via INTRAVENOUS
  Filled 2013-07-21 (×3): qty 2

## 2013-07-21 MED ORDER — MORPHINE SULFATE 2 MG/ML IJ SOLN
2.0000 mg | INTRAMUSCULAR | Status: DC | PRN
  Administered 2013-07-21 – 2013-07-22 (×5): 2 mg via INTRAVENOUS
  Filled 2013-07-21 (×4): qty 1

## 2013-07-21 MED FILL — Heparin Sodium (Porcine) Inj 1000 Unit/ML: INTRAMUSCULAR | Qty: 30 | Status: AC

## 2013-07-21 MED FILL — Potassium Chloride Inj 2 mEq/ML: INTRAVENOUS | Qty: 40 | Status: AC

## 2013-07-21 MED FILL — Magnesium Sulfate Inj 50%: INTRAMUSCULAR | Qty: 10 | Status: AC

## 2013-07-21 MED FILL — Dexmedetomidine HCl IV Soln 200 MCG/2ML: INTRAVENOUS | Qty: 2 | Status: AC

## 2013-07-21 NOTE — Progress Notes (Signed)
POD # 1 redo ASD and TVR  Comfortable  BP 128/66  Pulse 80  Temp(Src) 97.9 F (36.6 C) (Oral)  Resp 21  Ht 5\' 5"  (1.651 m)  Wt 175 lb 14.8 oz (79.8 kg)  BMI 29.28 kg/m2  SpO2 98%   Intake/Output Summary (Last 24 hours) at 07/21/13 1932 Last data filed at 07/21/13 1800  Gross per 24 hour  Intake 2171.18 ml  Output   3960 ml  Net -1788.82 ml   Creatinine= 0.8 K=4.5 Hct 34  Doing well

## 2013-07-21 NOTE — Discharge Instructions (Signed)
Activity: 1.May walk up steps °               2.No lifting more than ten pounds for four weeks.  °               3.No driving for four weeks. °               4.Stop any activity that causes chest pain, shortness of breath, dizziness, sweating or excessive weakness. °               5.Avoid straining. °               6.Continue with your breathing exercises daily. ° °Diet: Regular diet ° °Wound Care: May shower.  Clean wounds with mild soap and water daily. Contact the office at [phone] if any problems arise. ° ° °

## 2013-07-21 NOTE — Discharge Summary (Signed)
Physician Discharge Summary  Patient ID: [name] MRN: [id number] DOB/AGE: 1975/10/26 38 y.o.  Admit date: 07/20/2013 Discharge date: 07/26/2013     Admission Diagnoses:  Patient Active Problem List   Diagnosis Date Noted  . Chronic right-sided congestive heart failure   . Right ventricular dysfunction   . Partial anomalous pulmonary venous return   . Tricuspid regurgitation 05/26/2013  . Hyperglycemia 03/28/2013  . Diabetes type 2, uncontrolled 03/28/2013  . Status post thoracotomy   . Atypical chest pain 03/27/2013  . S/P patch closure of atrial septal defect 11/22/1997     Discharge Diagnoses:   Patient Active Problem List   Diagnosis Date Noted  . S/P redo atrial septal defect closure + tricuspid valve repair 07/20/2013  . S/P tricuspid valve repair 07/20/2013  . Chronic right-sided congestive heart failure   . Right ventricular dysfunction   . Partial anomalous pulmonary venous return   . Tricuspid regurgitation 05/26/2013  . Hyperglycemia 03/28/2013  . Diabetes type 2, uncontrolled 03/28/2013  . Status post thoracotomy   . Atypical chest pain 03/27/2013  . S/P patch closure of atrial septal defect 11/22/1997   Discharged Condition: good     History of Present Illness:   [name] is a 38 yo Hispanic Male with known history of Congenital Heart Disease.  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 in 1999.  The patient had an uncomplicated hospital stay and was discharged after 4-5 days.  However shortly after discharge home patient got readmitted with Respiratory failure and was hospitalized for nearly 3 months.  During that time he underwent left Thoracotomy with lung biopsy which showed evidence of pneumonia.  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 The patient has done well until about 6 months ago at which time he noticed he developed worsening exertional shortness of  breath.  He also noticed he became fatigued more easily and was eventually not able to keep up at work.  His symptoms continued to progress resulting in Hospitalization in December at [facility] with complaints of dyspnea and chest pain.  Echocardiogram was obtained and showed an enlarged right heart with left to right flow across the interatrial septum.  This prompted further evaluation with TEE which confirmed the presence of a large ASD.  He was subsequently referred to Dr. Excell Seltzer who evaluated the patient and felt he may be able to close the ASD through catheterization.  However, during the procedure it was felt he this was an unfavorable option due to anatomical characteristics.  Finally a cardiac gated CT Angiogram was obtained and the patient was referred to DR. Cornelius Moras at [facility] for possible surgical intervention.  He was initially evaluated by Dr. Cornelius Moras on 07/07/2013.  After review of patient's imaging studies he felt the patient had a fairly large ASD with what appears to be a partial anomalous pulmonary venous return with a right superior pulmonary vein draining directly into the SVC.  It was felt his original ASD was most likely a Sinus Venous Type ASD that was not identified at time of original surgery.  It was decided the patient would benefit from surgical intervention.  The risks and benefits of the procedure and were explained to the patient and he was agreeable to proceed.    Hospital Course:   The patient presented to [facility] on 07/20/2013.  He was taken to the operating room and underwent Redo Patch closure of an  ASD and a Tricuspid Valve Repair utilizing a 28mm Edwards MC3 Ring annuloplasty.  He tolerated the procedure well and was taken to the SICU in stable condition.  The patient was extubated the evening of surgery.  During his stay in the ICU the patient did well.  He did not require any inotropic support.  His chest tubes and arterial lines were removed without  difficulty.  He is requiring AAI pacing which will be weaned off as his heart rhythm tolerates.  He is medically stable and was transferred to the step down unit.   Overall, he has done well postoperatively.  Because of his preop hemoglobin A1C of 11.7, the diabetes coordinator and [facility] were consulted to assist with a home diabetes regimen.  The patient was continued on Metformin, but it was felt that he would require insulin for improved glycemic control.  His blood sugars have been reasonably stable while in the hospital.  Also, the case manager has arranged for outpatient follow up with the [facility], as well as a Engineer, water, and glucose testing supplies.  All of these issues have been discussed with the patient and family via an interpreter.   From a cardiac standpoint, he has remained stable.  He has had some bradycardia and junctional rhythm, and initially required pacing.  He has  not been started on a beta blocker.  Presently, his heart rate is stable and the pacer has been discontinued. Blood pressures are controlled.  He is ambulating in the halls without difficulty and is tolerating a diet.  Incisions are healing well.  He was started on Lasix for mild volume overload, and is diuresing well.  The patient is currently medically stable for discharge home on today's date.    Significant Diagnostic Studies:   Study data: Technically adequate study. - Left ventricle: The cavity size was normal. Wall thickness was normal. Systolic function was normal. The estimated ejection fraction was in the range of 55% to 60%. Doppler parameters are consistent with abnormal left ventricular relaxation (grade 1 diastolic dysfunction). - Left atrium: No evidence of thrombus in the atrial cavity or appendage. - Right ventricle: The cavity size was severely dilated. Systolic function was moderately reduced. - Right atrium: The atrium was severely dilated. - Atrial  septum: There is a secundum ASD with significant left to right shunt by color Doppler. The width of the defect is approximately 1.6 cm. Technically unable to obtain RVOT and LVOT Doppler images, not able to calculate Qp/Qs. - Tricuspid valve: Moderate-severe regurgitation. The regurgitation appears to be secondary to poor leaflet coaptation due to severe tricuspid anular dilatation.  1. Coronary calcium score of 0. This was 0 percentile for age and  sex matched control. Normal origin of coronary arteries, no evidence  of CAD, left dominance.  2. A large inferior sinus venosus atrial septal defect with  involvement of the superior portion of the IVC. Dilated IVC  overriding interatrial septum. There is possible partial anomalous  pulmonary venous return with right upper pulmonary vein draining  into SVC.  3. Dilated pulmonary arteries.  4. Severe right ventricular dilatation with right ventricular  hypertrophy. Severely dilated right atrium.  5. Mild left ventricular hypertrophy, mildly dilated left atrium.    Treatments: surgery:   Redo Patch Closure of Atrial Septal Defect Tricuspid Valve Repair Edwards MC3 ring annuloplasty (size 28mm, model #4900, serial #[id number])    Disposition: 01-Home or Self Care    Discharge Medications:  Medication List    STOP taking these medications       amiodarone 200 MG tablet  Commonly known as:  PACERONE     nitroGLYCERIN 0.4 MG SL tablet  Commonly known as:  NITROSTAT      TAKE these medications       aspirin 81 MG EC tablet  Take 1 tablet (81 mg total) by mouth daily.     furosemide 40 MG tablet  Commonly known as:  LASIX  Take 1 tablet (40 mg total) by mouth daily. For 4 days then stop.     insulin NPH-regular Human (70-30) 100 UNIT/ML injection  Commonly known as:  NOVOLIN 70/30  Inject 25 units sq in the morning with breakfast and 10 units in the evening with supper     metFORMIN 1000 MG tablet  Commonly known  as:  GLUCOPHAGE  Take 1 tablet (1,000 mg total) by mouth 2 (two) times daily with a meal.     oxyCODONE 5 MG immediate release tablet  Commonly known as:  Oxy IR/ROXICODONE  Take 1-2 tablets (5-10 mg total) by mouth every 4 (four) hours as needed for moderate pain.     potassium chloride SA 20 MEQ tablet  Commonly known as:  K-DUR,KLOR-CON  Take 1 tablet (20 mEq total) by mouth daily. For 4 days then stop.         Follow Up: Follow-up Information   Follow up with Purcell NailsWEN,CLARENCE H, MD On 08/14/2013. (Please have a [facility] at [facility] (first floor of same building) at 10:30. Appointment with Dr Cornelius Moraswen is at 11:30)    Specialty:  Cardiothoracic Surgery   Contact information:   [address] [place] [address] [phone]       Follow up with Joni ReiningKathryn Lawrence, NP On 08/08/2013. (Appointment is at 1:50)    Specialty:  Nurse Practitioner   Contact information:   [address] [place] [address] [phone]       Follow up with [facility].   Contact information:   [address] [place] [id number]  Call Norval GablePatricia Gilley, congregational nurse ASAP regarding screening for appointment.   Phone:  [phone]      Signed: Lowella DandyBARRETT, ERIN 07/21/2013, 12:42 PM

## 2013-07-21 NOTE — Progress Notes (Signed)
      [address]       [place] [id number]             [phone]        CARDIOTHORACIC SURGERY PROGRESS NOTE   R1 Day Post-Op Procedure(s) (LRB): REDO CLOSURE OF ATRIAL SEPTAL DEFECT (ASD) (N/A) TRICUSPID VALVE REPAIR (N/A) INTRAOPERATIVE TRANSESOPHAGEAL ECHOCARDIOGRAM (N/A)  Subjective: Looks good.  Minimal pain. No SOB.  Feels hungry.  Objective: Vital signs: BP Readings from Last 1 Encounters:  07/21/13 93/53   Pulse Readings from Last 1 Encounters:  07/21/13 79   Resp Readings from Last 1 Encounters:  07/21/13 17   Temp Readings from Last 1 Encounters:  07/21/13 98.4 F (36.9 C)     Hemodynamics: PAP: (17-35)/(8-27) 21/10 mmHg CO:  [5 L/min-6.6 L/min] 6.6 L/min CI:  [2.7 L/min/m2-3.5 L/min/m2] 3.5 L/min/m2  Physical Exam:  Rhythm:   Junctional 50's, AAI paced  Breath sounds: clear  Heart sounds:  RRR w/out murmur  Incisions:  Dressing dry, intact  Abdomen:  Soft, non-distended, non-tender  Extremities:  Warm, well-perfused    Intake/Output from previous day: 03/26 0701 - 03/27 0700 In: 8498.6 [I.V.:6035.6; Blood:923; NG/GT:90; IV Piggyback:1450] Out: 7060 [Urine:5730; Emesis/NG output:150; Blood:800; Chest Tube:380] Intake/Output this shift:    Lab Results:  CBC: Recent Labs  07/20/13 2100 07/21/13 0415  WBC 11.4* 9.5  HGB 12.8* 11.4*  HCT 34.9* 31.7*  PLT 92* 91*    BMET:  Recent Labs  07/20/13 1458 07/20/13 2100 07/21/13 0415  NA 142  --  136*  K 3.3*  --  3.9  CL  --   --  102  CO2  --   --  23  GLUCOSE 95  --  128*  BUN  --   --  8  CREATININE  --  0.56 0.52  CALCIUM  --   --  7.5*     CBG (last 3)   Recent Labs  07/20/13 2018 07/20/13 2204 07/20/13 2325  GLUCAP 136* 152* 160*    ABG    Component Value Date/Time   PHART 7.388 07/20/2013 1458   PCO2ART 40.2 07/20/2013 1458   PO2ART 70.0* 07/20/2013 1458   HCO3 24.2* 07/20/2013 1458   TCO2 25 07/20/2013 1458   ACIDBASEDEF 1.0 07/20/2013 1458   O2SAT 94.0 07/20/2013 1458    CXR: Mild pulm vasc congestion  Assessment/Plan: S/P Procedure(s) (LRB): REDO CLOSURE OF ATRIAL SEPTAL DEFECT (ASD) (N/A) TRICUSPID VALVE REPAIR (N/A) INTRAOPERATIVE TRANSESOPHAGEAL ECHOCARDIOGRAM (N/A)  Doing very well POD1 Junctional rhythm, AAI pacing w/ stable hemodynamics no drips Expected post op acute blood loss anemia, very mild Expected post op volume excess, mild Post op thrombocytopenia, stable Type II diabetes mellitus, poorly controlled pre-op, excellent glycemic control on insulin drip   Mobilize  D/C tubes and lines  Continue AAI pacing and hold beta blockers  Diuresis  Possible transfer step down later today   [name],[name] 07/21/2013 7:26 AM

## 2013-07-21 NOTE — Op Note (Signed)
[name], [name] NO.:  [id number]  MEDICAL RECORD NO.:  [id number]  LOCATION:  2S02C                        FACILITY:  [facility]  PHYSICIAN:  Burna Forts, M.D.DATE OF BIRTH:  Aug 16, 1975  DATE OF PROCEDURE:  07/20/2013 DATE OF DISCHARGE:                              OPERATIVE REPORT   INDICATIONS FOR PROCEDURE:  [name] is a 38 year old Hispanic- speaking male who presents to the OR today for closure of atrial septal defect and tricuspid repair or replacement.  He has a history of significant shortness of breath and congestive heart failure due to the cardiac abnormalities.  On the morning of surgery, he is brought to the holding area where under local anesthesia with sedation, introducer central line and arterial line access are obtained.  He was then taken to the OR for routine induction of general anesthesia, after which, the TEE probe was prepared, passed oropharyngeally, for imaging of the cardiac structures.  PRECARDIOPULMONARY BYPASS TEE EXAMINATION:  Left ventricle:  The left ventricular chamber is seen initially in the short axis view.  Overall, there is normal left ventricular chamber size, shape, and function. Lateral anterior and inferior walls are normally contractile.  The septal wall, however, is slightly flattened and does not contract in the usual fashion likely due to the large size of the right ventricular chamber itself.  Papillary muscles are well outlined.  Both long and short axis views are obtained.  Overall, there is normal left ventricular contractile pattern appreciated.  Mitral valve:  The mitral valve is thin, compliant, mobile.  The area of coaptation is just below the level of the annulus.  There is normal valvular substructures appreciated.  On coaptation, there is only trivial central regurgitant jet appreciated at the mitral valve level.  Left atrium:  Left atrial chamber is essentially  normal-appearing chamber in size and shape.  The left atrial appendage is visualized and is clear.  The interatrial septum will be addressed in the right atrial view.  Aortic valve:  There is a trileaflet aortic valve.  The leaflets are thin, mobile and normal.  Overall, there is normal function in form of the aortic valve.  Right ventricle:  The right ventricular chamber is seen initially in the four-chamber view.  It is markedly dilated.  Right ventricular chamber is approximately 3 times its normal size.  The right ventricular free wall is contractile with diminishment of the contractility at the area of the apex of the right ventricular chamber.  No masses are noted within.  Tricuspid valve:  It is a trileaflet tricuspid valve.  The annular area is enlarged dramatically.  It is measured at approximately 5.8 cm in its diameter.  Color Doppler across this valve reveals a moderate-to-large regurgitant jet well into the right atrial chamber, medially directed towards the interatrial septal area.  Also seen in this view is a very large coronary sinus that is appreciated.  Right atrium:  The right atrial chamber is also enlarged.  Of note, in the septal area, there is an apparent secundum ASD appreciated.  This is easily visualized as an approximately 2 cm in diameter, oval-shaped ASD. On color Doppler, it was easy to demonstrate a large left-to-right flow across the atrial septal  defect.  Multiple images are obtained as well as 3D imagery of the ASD.  The patient is placed on cardiopulmonary bypass.  Hypothermia is begun. The repair is that of a closure of the atrial septal defect and ring annuloplasty repair of the tricuspid valve.  Deairing maneuvers were carried out.  The patient has rewarmed and separated from cardiopulmonary bypass with the initial attempt.  POSTCARDIOPULMONARY BYPASS TEE EXAMINATION:  Left ventricle:  The left ventricular chamber is visualized again in the  short axis view. Overall, contractility remains good to excellent.  Right ventricle:  The right ventricular chamber remains enlarged, however, not quite as enlarged as in the prebypass.  Now in the area of the tricuspid valve itself, we could see the ring annuloplasty repair. Multiple views are obtained in this area, which showed now the leaflets of the valve of the tricuspid valve that appear to now coapt.  Color Doppler across this valve shows essentially no regurgitant flow appreciated.  On further withdrawal back of the TEE probe, our attention was then directed to the area of the interatrial septum.  We could see the patch repair area well.  Multiple views again are obtained of this area of the previously ASD.  It is closed.  Color Doppler again in multiple views shows no shunt or color flow across the area of the previous ASD.  This appears to be a satisfactory repair.  Rest of the cardiac examination was without significant change.  The patient was ultimately returned to the Cardiac Intensive Care Unit in stable condition.          ______________________________ Burna FortsJames T. Ebin Palazzi, M.D.     JTM/MEDQ  D:  07/20/2013  T:  07/21/2013  Job:  [id number]

## 2013-07-22 ENCOUNTER — Inpatient Hospital Stay (HOSPITAL_COMMUNITY): Payer: Medicaid Other

## 2013-07-22 LAB — TYPE AND SCREEN
ABO/RH(D): O POS
ANTIBODY SCREEN: NEGATIVE
UNIT DIVISION: 0
UNIT DIVISION: 0
UNIT DIVISION: 0
Unit division: 0
Unit division: 0
Unit division: 0

## 2013-07-22 LAB — BASIC METABOLIC PANEL
BUN: 9 mg/dL (ref 6–23)
CHLORIDE: 96 meq/L (ref 96–112)
CO2: 28 mEq/L (ref 19–32)
CREATININE: 0.62 mg/dL (ref 0.50–1.35)
Calcium: 8.2 mg/dL — ABNORMAL LOW (ref 8.4–10.5)
GFR calc Af Amer: >90 mL/min (ref >90)
GFR calc non Af Amer: >90 mL/min (ref >90)
Glucose, Bld: 167 mg/dL — ABNORMAL HIGH (ref 70–99)
Potassium: 3.7 mEq/L (ref 3.7–5.3)
Sodium: 135 mEq/L — ABNORMAL LOW (ref 137–147)

## 2013-07-22 LAB — GLUCOSE, CAPILLARY
GLUCOSE-CAPILLARY: 181 mg/dL — AB (ref 70–99)
GLUCOSE-CAPILLARY: 195 mg/dL — AB (ref 70–99)
GLUCOSE-CAPILLARY: 256 mg/dL — AB (ref 70–99)
Glucose-Capillary: 184 mg/dL — ABNORMAL HIGH (ref 70–99)
Glucose-Capillary: 130 mg/dL — ABNORMAL HIGH (ref 70–99)
Glucose-Capillary: 153 mg/dL — ABNORMAL HIGH (ref 70–99)

## 2013-07-22 LAB — CBC
HEMATOCRIT: 34.3 % — AB (ref 39.0–52.0)
Hemoglobin: 12 g/dL — ABNORMAL LOW (ref 13.0–17.0)
MCH: 32.7 pg (ref 26.0–34.0)
MCHC: 35 g/dL (ref 30.0–36.0)
MCV: 93.5 fL (ref 78.0–100.0)
Platelets: 103 10*3/uL — ABNORMAL LOW (ref 150–400)
RBC: 3.67 MIL/uL — ABNORMAL LOW (ref 4.22–5.81)
RDW: 13 % (ref 11.5–15.5)
WBC: 12.2 10*3/uL — ABNORMAL HIGH (ref 4.0–10.5)

## 2013-07-22 MED ORDER — FUROSEMIDE 10 MG/ML IJ SOLN
20.0000 mg | Freq: Two times a day (BID) | INTRAMUSCULAR | Status: AC
  Administered 2013-07-22 (×2): 20 mg via INTRAVENOUS
  Filled 2013-07-22 (×2): qty 2

## 2013-07-22 MED ORDER — POTASSIUM CHLORIDE 10 MEQ/50ML IV SOLN
10.0000 meq | INTRAVENOUS | Status: AC | PRN
  Administered 2013-07-22 (×3): 10 meq via INTRAVENOUS

## 2013-07-22 MED ORDER — METFORMIN HCL 500 MG PO TABS
1000.0000 mg | ORAL_TABLET | Freq: Two times a day (BID) | ORAL | Status: DC
  Administered 2013-07-22 – 2013-07-26 (×8): 1000 mg via ORAL
  Filled 2013-07-22 (×10): qty 2

## 2013-07-22 NOTE — Progress Notes (Signed)
2 Days Post-Op Procedure(s) (LRB): REDO CLOSURE OF ATRIAL SEPTAL DEFECT (ASD) (N/A) TRICUSPID VALVE REPAIR (N/A) INTRAOPERATIVE TRANSESOPHAGEAL ECHOCARDIOGRAM (N/A) Subjective: Up in chair Some incisional pain  Objective: Vital signs in last 24 hours: Temp:  [97.7 F (36.5 C)-99 F (37.2 C)] 98.8 F (37.1 C) (03/28 0744) Pulse Rate:  [79-80] 79 (03/28 0830) Cardiac Rhythm:  [-] Atrial paced;Junctional rhythm (03/28 0600) Resp:  [12-29] 14 (03/28 0830) BP: (100-138)/(57-85) 129/72 mmHg (03/28 0800) SpO2:  [94 %-99 %] 97 % (03/28 0830) Arterial Line BP: (159)/(76) 159/76 mmHg (03/27 0900) Weight:  [181 lb 12.8 oz (82.464 kg)] 181 lb 12.8 oz (82.464 kg) (03/28 0458)  Hemodynamic parameters for last 24 hours:    Intake/Output from previous day: 03/27 0701 - 03/28 0700 In: 1083 [P.O.:720; I.V.:3; IV Piggyback:300] Out: 4460 [Urine:4080; Emesis/NG output:100; Chest Tube:280] Intake/Output this shift: Total I/O In: 170 [P.O.:120; IV Piggyback:50] Out: -   General appearance: alert, cooperative and no distress Neurologic: intact Heart: regular rate and rhythm Lungs: diminished breath sounds bibasilar  Lab Results:  Recent Labs  07/21/13 1700 07/21/13 1707 07/22/13 0410  WBC 11.8*  --  12.2*  HGB 12.5* 12.6* 12.0*  HCT 34.7* 37.0* 34.3*  PLT 91*  --  103*   BMET:  Recent Labs  07/21/13 0415  07/21/13 1707 07/22/13 0410  NA 136*  --  132* 135*  K 3.9  --  4.5 3.7  CL 102  --  94* 96  CO2 23  --   --  28  GLUCOSE 128*  --  169* 167*  BUN 8  --  6 9  CREATININE 0.52  < > 0.80 0.62  CALCIUM 7.5*  --   --  8.2*  < > = values in this interval not displayed.  PT/INR:  Recent Labs  07/20/13 1500  LABPROT 19.2*  INR 1.67*   ABG    Component Value Date/Time   PHART 7.340* 07/21/2013 0006   HCO3 22.6 07/21/2013 0006   TCO2 26 07/21/2013 1707   ACIDBASEDEF 3.0* 07/21/2013 0006   O2SAT 96.0 07/21/2013 0006   CBG (last 3)   Recent Labs  07/21/13 1921  07/22/13 0016 07/22/13 0425  GLUCAP 267* 181* 184*    Assessment/Plan: S/P Procedure(s) (LRB): REDO CLOSURE OF ATRIAL SEPTAL DEFECT (ASD) (N/A) TRICUSPID VALVE REPAIR (N/A) INTRAOPERATIVE TRANSESOPHAGEAL ECHOCARDIOGRAM (N/A) POD # 2 CV- junctional rhythm in high 50s - continue atrial pacing  RESP- IS  RENAL- diuresed well yesterday, lasix 20 mg IV BID today  DC CT  CBG elevated- restart metformin, continue levemir + SSI  Ambulate   LOS: 2 days    Destine Zirkle C 07/22/2013

## 2013-07-23 ENCOUNTER — Inpatient Hospital Stay (HOSPITAL_COMMUNITY): Payer: Medicaid Other

## 2013-07-23 LAB — CBC
HCT: 30.3 % — ABNORMAL LOW (ref 39.0–52.0)
Hemoglobin: 10.5 g/dL — ABNORMAL LOW (ref 13.0–17.0)
MCH: 32.7 pg (ref 26.0–34.0)
MCHC: 34.7 g/dL (ref 30.0–36.0)
MCV: 94.4 fL (ref 78.0–100.0)
PLATELETS: 106 10*3/uL — AB (ref 150–400)
RBC: 3.21 MIL/uL — ABNORMAL LOW (ref 4.22–5.81)
RDW: 12.9 % (ref 11.5–15.5)
WBC: 9.7 10*3/uL (ref 4.0–10.5)

## 2013-07-23 LAB — GLUCOSE, CAPILLARY
GLUCOSE-CAPILLARY: 181 mg/dL — AB (ref 70–99)
Glucose-Capillary: 142 mg/dL — ABNORMAL HIGH (ref 70–99)
Glucose-Capillary: 134 mg/dL — ABNORMAL HIGH (ref 70–99)
Glucose-Capillary: 168 mg/dL — ABNORMAL HIGH (ref 70–99)
Glucose-Capillary: 118 mg/dL — ABNORMAL HIGH (ref 70–99)
Glucose-Capillary: 190 mg/dL — ABNORMAL HIGH (ref 70–99)

## 2013-07-23 LAB — BASIC METABOLIC PANEL
BUN: 10 mg/dL (ref 6–23)
CHLORIDE: 94 meq/L — AB (ref 96–112)
CO2: 28 mEq/L (ref 19–32)
CREATININE: 0.65 mg/dL (ref 0.50–1.35)
Calcium: 8.2 mg/dL — ABNORMAL LOW (ref 8.4–10.5)
Glucose, Bld: 150 mg/dL — ABNORMAL HIGH (ref 70–99)
Potassium: 3.6 mEq/L — ABNORMAL LOW (ref 3.7–5.3)
Sodium: 132 mEq/L — ABNORMAL LOW (ref 137–147)

## 2013-07-23 MED ORDER — SODIUM CHLORIDE 0.9 % IV SOLN: 250.0000 mL | INTRAVENOUS | Status: DC | PRN

## 2013-07-23 MED ORDER — INSULIN ASPART 100 UNIT/ML ~~LOC~~ SOLN
4.0000 [IU] | Freq: Three times a day (TID) | SUBCUTANEOUS | Status: DC
  Administered 2013-07-23 – 2013-07-26 (×7): 4 [IU] via SUBCUTANEOUS

## 2013-07-23 MED ORDER — MOVING RIGHT ALONG BOOK
Freq: Once | Status: AC
  Administered 2013-07-23: 18:00:00
  Filled 2013-07-23: qty 1

## 2013-07-23 MED ORDER — ALUM & MAG HYDROXIDE-SIMETH 200-200-20 MG/5ML PO SUSP
15.0000 mL | ORAL | Status: DC | PRN
  Administered 2013-07-24 – 2013-07-26 (×4): 30 mL via ORAL
  Filled 2013-07-23 (×4): qty 30

## 2013-07-23 MED ORDER — ALPRAZOLAM 0.25 MG PO TABS: 0.2500 mg | ORAL_TABLET | Freq: Four times a day (QID) | ORAL | Status: DC | PRN

## 2013-07-23 MED ORDER — MAGNESIUM HYDROXIDE 400 MG/5ML PO SUSP
30.0000 mL | Freq: Every day | ORAL | Status: DC | PRN
  Administered 2013-07-25: 30 mL via ORAL
  Filled 2013-07-23: qty 30

## 2013-07-23 MED ORDER — GUAIFENESIN-DM 100-10 MG/5ML PO SYRP: 15.0000 mL | ORAL_SOLUTION | ORAL | Status: DC | PRN

## 2013-07-23 MED ORDER — INSULIN ASPART 100 UNIT/ML ~~LOC~~ SOLN: 0.0000 [IU] | Freq: Every day | SUBCUTANEOUS | Status: DC

## 2013-07-23 MED ORDER — POTASSIUM CHLORIDE CRYS ER 20 MEQ PO TBCR
20.0000 meq | EXTENDED_RELEASE_TABLET | Freq: Two times a day (BID) | ORAL | Status: DC
  Administered 2013-07-23 (×2): 20 meq via ORAL
  Filled 2013-07-23 (×4): qty 1

## 2013-07-23 MED ORDER — POTASSIUM CHLORIDE 10 MEQ/50ML IV SOLN
10.0000 meq | INTRAVENOUS | Status: AC
  Administered 2013-07-23 (×3): 10 meq via INTRAVENOUS
  Filled 2013-07-23 (×2): qty 50

## 2013-07-23 MED ORDER — FUROSEMIDE 40 MG PO TABS
40.0000 mg | ORAL_TABLET | Freq: Every day | ORAL | Status: DC
  Administered 2013-07-23 – 2013-07-26 (×4): 40 mg via ORAL
  Filled 2013-07-23 (×4): qty 1

## 2013-07-23 MED ORDER — SODIUM CHLORIDE 0.9 % IJ SOLN: 3.0000 mL | INTRAMUSCULAR | Status: DC | PRN

## 2013-07-23 MED ORDER — INSULIN ASPART 100 UNIT/ML ~~LOC~~ SOLN
0.0000 [IU] | Freq: Three times a day (TID) | SUBCUTANEOUS | Status: DC
Start: 2013-07-23 — End: 2013-07-26
  Administered 2013-07-23: 3 [IU] via SUBCUTANEOUS
  Administered 2013-07-23: 2 [IU] via SUBCUTANEOUS
  Administered 2013-07-24: 3 [IU] via SUBCUTANEOUS
  Administered 2013-07-24 – 2013-07-25 (×2): 2 [IU] via SUBCUTANEOUS
  Administered 2013-07-25 (×2): 3 [IU] via SUBCUTANEOUS
  Administered 2013-07-26 (×2): 2 [IU] via SUBCUTANEOUS

## 2013-07-23 MED ORDER — SODIUM CHLORIDE 0.9 % IJ SOLN
3.0000 mL | Freq: Two times a day (BID) | INTRAMUSCULAR | Status: DC
  Administered 2013-07-24 – 2013-07-25 (×3): 3 mL via INTRAVENOUS

## 2013-07-23 MED ORDER — MOVING RIGHT ALONG BOOK- IN SPANISH
Freq: Once | Status: DC
  Filled 2013-07-23: qty 1

## 2013-07-23 NOTE — Progress Notes (Signed)
3 Days Post-Op Procedure(s) (LRB): REDO CLOSURE OF ATRIAL SEPTAL DEFECT (ASD) (N/A) TRICUSPID VALVE REPAIR (N/A) INTRAOPERATIVE TRANSESOPHAGEAL ECHOCARDIOGRAM (N/A) Subjective: Some incisional and back pain  Objective: Vital signs in last 24 hours: Temp:  [98.3 F (36.8 C)-99.5 F (37.5 C)] 98.3 F (36.8 C) (03/29 0715) Pulse Rate:  [79-83] 80 (03/29 0730) Cardiac Rhythm:  [-] Atrial paced;Junctional rhythm (03/29 0730) Resp:  [13-50] 23 (03/29 0730) BP: (92-130)/(50-77) 102/61 mmHg (03/29 0700) SpO2:  [90 %-100 %] 96 % (03/29 0730) Weight:  [180 lb 12.4 oz (82 kg)] 180 lb 12.4 oz (82 kg) (03/29 0500)  Hemodynamic parameters for last 24 hours:    Intake/Output from previous day: 03/28 0701 - 03/29 0700 In: 1830 [P.O.:1680; IV Piggyback:150] Out: 2050 [Urine:2000; Chest Tube:50] Intake/Output this shift:    General appearance: alert and no distress Neurologic: intact Heart: regular rate and rhythm Lungs: clear to auscultation bilaterally Abdomen: normal findings: soft, non-tender  Lab Results:  Recent Labs  07/22/13 0410 07/23/13 0355  WBC 12.2* 9.7  HGB 12.0* 10.5*  HCT 34.3* 30.3*  PLT 103* 106*   BMET:  Recent Labs  07/22/13 0410 07/23/13 0355  NA 135* 132*  K 3.7 3.6*  CL 96 94*  CO2 28 28  GLUCOSE 167* 150*  BUN 9 10  CREATININE 0.62 0.65  CALCIUM 8.2* 8.2*    PT/INR:  Recent Labs  07/20/13 1500  LABPROT 19.2*  INR 1.67*   ABG    Component Value Date/Time   PHART 7.340* 07/21/2013 0006   HCO3 22.6 07/21/2013 0006   TCO2 26 07/21/2013 1707   ACIDBASEDEF 3.0* 07/21/2013 0006   O2SAT 96.0 07/21/2013 0006   CBG (last 3)   Recent Labs  07/22/13 1935 07/23/13 0012 07/23/13 0356  GLUCAP 153* 181* 142*    Assessment/Plan: S/P Procedure(s) (LRB): REDO CLOSURE OF ATRIAL SEPTAL DEFECT (ASD) (N/A) TRICUSPID VALVE REPAIR (N/A) INTRAOPERATIVE TRANSESOPHAGEAL ECHOCARDIOGRAM (N/A) Plan for transfer to step-down: see transfer orders Doing  well He is in SR at 70 - changed pacer to VVI at 60 Supplement K transfer to 2000   LOS: 3 days    Amoy Steeves C 07/23/2013

## 2013-07-24 ENCOUNTER — Inpatient Hospital Stay (HOSPITAL_COMMUNITY): Payer: Medicaid Other

## 2013-07-24 LAB — CBC
HEMATOCRIT: 29.2 % — AB (ref 39.0–52.0)
HEMOGLOBIN: 10.2 g/dL — AB (ref 13.0–17.0)
MCH: 33.1 pg (ref 26.0–34.0)
MCHC: 34.9 g/dL (ref 30.0–36.0)
MCV: 94.8 fL (ref 78.0–100.0)
Platelets: 154 10*3/uL (ref 150–400)
RBC: 3.08 MIL/uL — ABNORMAL LOW (ref 4.22–5.81)
RDW: 13 % (ref 11.5–15.5)
WBC: 8 10*3/uL (ref 4.0–10.5)

## 2013-07-24 LAB — BASIC METABOLIC PANEL
BUN: 10 mg/dL (ref 6–23)
CHLORIDE: 95 meq/L — AB (ref 96–112)
CO2: 26 mEq/L (ref 19–32)
Calcium: 8.1 mg/dL — ABNORMAL LOW (ref 8.4–10.5)
Creatinine, Ser: 0.59 mg/dL (ref 0.50–1.35)
GFR calc Af Amer: >90 mL/min (ref >90)
GFR calc non Af Amer: >90 mL/min (ref >90)
GLUCOSE: 191 mg/dL — AB (ref 70–99)
POTASSIUM: 4.2 meq/L (ref 3.7–5.3)
Sodium: 135 mEq/L — ABNORMAL LOW (ref 137–147)

## 2013-07-24 LAB — GLUCOSE, CAPILLARY
GLUCOSE-CAPILLARY: 199 mg/dL — AB (ref 70–99)
GLUCOSE-CAPILLARY: 135 mg/dL — AB (ref 70–99)
Glucose-Capillary: 210 mg/dL — ABNORMAL HIGH (ref 70–99)
Glucose-Capillary: 140 mg/dL — ABNORMAL HIGH (ref 70–99)

## 2013-07-24 MED ORDER — METOPROLOL TARTRATE 12.5 MG HALF TABLET
12.5000 mg | ORAL_TABLET | Freq: Two times a day (BID) | ORAL | Status: DC
  Filled 2013-07-24 (×2): qty 1

## 2013-07-24 MED ORDER — ASPIRIN EC 81 MG PO TBEC
81.0000 mg | DELAYED_RELEASE_TABLET | Freq: Every day | ORAL | Status: DC
  Administered 2013-07-24 – 2013-07-26 (×3): 81 mg via ORAL
  Filled 2013-07-24 (×3): qty 1

## 2013-07-24 MED ORDER — POTASSIUM CHLORIDE CRYS ER 20 MEQ PO TBCR
20.0000 meq | EXTENDED_RELEASE_TABLET | Freq: Every day | ORAL | Status: DC
  Administered 2013-07-24 – 2013-07-26 (×3): 20 meq via ORAL
  Filled 2013-07-24 (×2): qty 1

## 2013-07-24 NOTE — Progress Notes (Signed)
Pt requesting Maalox at this time; pt given PO Maalox; will cont. To monitor.

## 2013-07-24 NOTE — Progress Notes (Signed)
UR Completed.  [name] Jane [phone] 07/24/2013  

## 2013-07-24 NOTE — Progress Notes (Addendum)
L [place] central line d/c at this time; manual pressure held; vasoline gauze applied to site; bedrest until 1420; will cont. To monitor.

## 2013-07-24 NOTE — Progress Notes (Addendum)
[address]       [place] [address]             [phone]      4 Days Post-Op  Procedure(s) (LRB): REDO CLOSURE OF ATRIAL SEPTAL DEFECT (ASD) (N/A) TRICUSPID VALVE REPAIR (N/A) INTRAOPERATIVE TRANSESOPHAGEAL ECHOCARDIOGRAM (N/A) Subjective: Feels ok, not SOB, ambulation improving  Objective  Telemetry sinus rhythm   Temp:  [98.5 F (36.9 C)-99.5 F (37.5 C)] 99.5 F (37.5 C) (03/30 0306) Pulse Rate:  [61-83] 68 (03/30 0306) Resp:  [18-21] 18 (03/30 0306) BP: (111-127)/(65-67) 115/65 mmHg (03/30 0306) SpO2:  [87 %-98 %] 98 % (03/30 0306) Weight:  [181 lb 10.5 oz (82.4 kg)] 181 lb 10.5 oz (82.4 kg) (03/30 0306)   Intake/Output Summary (Last 24 hours) at 07/24/13 1218 Last data filed at 07/24/13 1131  Gross per 24 hour  Intake    240 ml  Output    650 ml  Net   -410 ml       General appearance: alert, cooperative and no distress Heart: regular rate and rhythm and soft systolic murmur Lungs: clear to auscultation bilaterally Abdomen: benign Extremities: + LE edema Wound: incis healing well  Lab Results:  Recent Labs  07/21/13 1700  07/23/13 0355 07/24/13 0415  NA  --   < > 132* 135*  K  --   < > 3.6* 4.2  CL  --   < > 94* 95*  CO2  --   < > 28 26  GLUCOSE  --   < > 150* 191*  BUN  --   < > 10 10  CREATININE 0.60  < > 0.65 0.59  CALCIUM  --   < > 8.2* 8.1*  MG 1.8  --   --   --   < > = values in this interval not displayed. No results found for this basename: AST, ALT, ALKPHOS, BILITOT, PROT, ALBUMIN,  in the last 72 hours No results found for this basename: LIPASE, AMYLASE,  in the last 72 hours  Recent Labs  07/23/13 0355 07/24/13 0415  WBC 9.7 8.0  HGB 10.5* 10.2*  HCT 30.3* 29.2*  MCV 94.4 94.8  PLT 106* 154   No results found for this basename: CKTOTAL, CKMB, TROPONINI,  in the last 72 hours No components found with this basename: POCBNP,  No results found for this basename: DDIMER,  in the last 72 hours No  results found for this basename: HGBA1C,  in the last 72 hours No results found for this basename: CHOL, HDL, LDLCALC, TRIG, CHOLHDL,  in the last 72 hours No results found for this basename: TSH, T4TOTAL, FREET3, T3FREE, THYROIDAB,  in the last 72 hours No results found for this basename: VITAMINB12, FOLATE, FERRITIN, TIBC, IRON, RETICCTPCT,  in the last 72 hours  Medications: Scheduled . acetaminophen  1,000 mg Oral 4 times per day  . aspirin EC  81 mg Oral Daily  . bisacodyl  10 mg Oral Daily   Or  . bisacodyl  10 mg Rectal Daily  . docusate sodium  200 mg Oral Daily  . furosemide  40 mg Oral Daily  . insulin aspart  0-15 Units Subcutaneous TID WC  . insulin aspart  0-5 Units Subcutaneous QHS  . insulin aspart  4 Units Subcutaneous TID WC  . insulin detemir  20 Units Subcutaneous BID  . metFORMIN  1,000 mg Oral BID WC  . moving right along book- in spanish  Does not apply Once  . mupirocin ointment   Nasal BID  . pantoprazole  40 mg Oral Daily  . potassium chloride  20 mEq Oral Daily  . sodium chloride  3 mL Intravenous Q12H     Radiology/Studies:  Dg Chest 2 View  07/24/2013   CLINICAL DATA:  Status post ASD closure and tricuspid valve repair.  EXAM: CHEST - 2 VIEW  COMPARISON:  DG CHEST 1V PORT dated 07/23/2013; DG CHEST 1V PORT dated 07/22/2013  FINDINGS: Stable positioning of central line. There is slight worsening of bilateral perihilar and lower lung atelectasis. No pneumothorax, pulmonary edema or pleural effusions are identified. The heart size and mediastinal contours are stable.  IMPRESSION: No active disease.   Electronically Signed   By: Irish LackGlenn  Yamagata M.D.   On: 07/24/2013 08:03   Dg Chest Port 1 View  07/23/2013   CLINICAL DATA:  Chest pain.  ASD repair.  EXAM: PORTABLE CHEST - 1 VIEW  COMPARISON:  DG CHEST 1V PORT dated 07/22/2013; CT HEART MORPH/PULM VEIN W/CM&W/O CA SCORE dated 06/30/2013; DG CHEST 2 VIEW dated 03/27/2013; CT CTA ABD/PEL W/CM AND/OR W/O CM dated  07/13/2013  FINDINGS: Worsening lung aeration with streaky basilar opacities bilaterally. There is cardiomegaly and pulmonary venous congestion. Unchanged positioning of a left subclavian central line. No evidence of effusion or pneumothorax.  IMPRESSION: 1. Postoperative atelectasis has mildly increased. 2. Pulmonary venous congestion.   Electronically Signed   By: Tiburcio PeaJonathan  Watts M.D.   On: 07/23/2013 06:35    INR: Will add last result for INR, ABG once components are confirmed Will add last 4 CBG results once components are confirmed  Assessment/Plan: S/P Procedure(s) (LRB): REDO CLOSURE OF ATRIAL SEPTAL DEFECT (ASD) (N/A) TRICUSPID VALVE REPAIR (N/A) INTRAOPERATIVE TRANSESOPHAGEAL ECHOCARDIOGRAM (N/A)  1 doing well, progressing nicely 2 H/H pretty stable 3 renal fxn normal 4 cbg's 150-190's 5 cont gentle diuresis 6 puln toilet/rehab- cont   LOS: 4 days    GOLD,WAYNE E 3/30/201512:18 PM  I have seen and examined the patient and agree with the assessment and plan as outlined.  Tentatively for d/c home tomorrow.  Maintaining sinus rhythm or accelerated junctional rhythm w/ stable HR but continue to hold beta blocker due to post-op sinus node dysfunction, which is not unexpected.  OWEN,CLARENCE H 07/24/2013

## 2013-07-24 NOTE — Progress Notes (Signed)
EPW d/c at this timer per MD order; pt tolerated well; VSS; bedrest until 1410; will cont. To monitor.

## 2013-07-24 NOTE — Progress Notes (Signed)
CARDIAC REHAB PHASE I   PRE:  Rate/Rhythm: 60 SR    BP: sitting 150/60    SaO2: 93 RA  MODE:  Ambulation: 900 ft   POST:  Rate/Rhythm: 83 SR    BP: sitting 140/60     SaO2: 94 RA  Pt awoke and stated he had 9//10 back pain, sts it is his left scapula. Able to walk without problems using the RW. Steady, no c/o except when asked about his back and grimaces.  To recliner. Sts he has been sleeping during the day and not at night. Encouraged him to sit up and stay awake today. [id number]  Elissa LovettReeve, Terry Gail Kicking HorseKristan CES, ACSM 07/24/2013 11:01 AM

## 2013-07-24 NOTE — Care Management Note (Signed)
    Page 1 of 2   07/25/2013     4:29:28 PM   CARE MANAGEMENT NOTE 07/25/2013  Patient:  [name],[name]   Account Number:  [id number]  Date Initiated:  07/24/2013  Documentation initiated by:  Margareth Kanner  Subjective/Objective Assessment:   PT S/P ASD REPAIR AND TVR ON 07/24/13.  PTA, PT INDEPENDENT, LIVES WITH SIGNIFICANT OTHER.     Action/Plan:   WILL FOLLOW FOR DC NEEDS AS PT PROGRESSES.   Anticipated DC Date:  07/25/2013   Anticipated DC Plan:  [place]  CM consult  [facility]  PCP issues      Choice offered to / List presented to:             Status of service:  Completed, signed off Medicare Important Message given?   (If response is "NO", the following Medicare IM given date fields will be blank) Date Medicare IM given:   Date Additional Medicare IM given:    Discharge Disposition:  HOME/SELF CARE  Per UR Regulation:  Reviewed for med. necessity/level of care/duration of stay  If discussed at [place] of Stay Meetings, dates discussed:   07/25/2013    Comments:  07/25/13 Jarrel Knoke,RN,BSN [phone] MET WITH PT AND FAMILY MEMBERS TO DISCUSS DC PLANNING WITH INTERPRETER PRESENT.  PT HAS NO INSURANCE, AND NO PCP.  PT WILL NEED FOLLOW UP OF DIABETES AT DC, AND DISCHARGING ON INSULIN.  PT STATES HE HAS GONE TO [facility] [facility] IN THE PAST, BUT CANNOT AFFORD THE $80 OFFICE VISIT CHARGE.  PT GIVEN INFORMATION FOR FREE CLINIC OF [place] IN SPANISH; [facility]PT WILL BE FREE OF CHARGE.  PT WILL NEED TO CALL PATRICIA GILLEY, CONGREGATIONAL NURSE FIRST FOR SCREENING.([phone]).  CLINIC OFFERS MEDICATION ASSISTANCE. UNFORTUNATELY, MR. LOMA IS NOT ELIGIBLE FOR [place] MATCH PROGRAM, AS HE HAS USED THE PROGRAM WITHIN THE LAST YEAR. MEDS SHOULD BE $4 WITH EXCEPTION OF 70/30 INSULIN, WHICH SHOULD BE $25.

## 2013-07-25 DIAGNOSIS — I379 Nonrheumatic pulmonary valve disorder, unspecified: Secondary | ICD-10-CM

## 2013-07-25 DIAGNOSIS — E109 Type 1 diabetes mellitus without complications: Secondary | ICD-10-CM

## 2013-07-25 LAB — GLUCOSE, CAPILLARY
GLUCOSE-CAPILLARY: 131 mg/dL — AB (ref 70–99)
Glucose-Capillary: 172 mg/dL — ABNORMAL HIGH (ref 70–99)
Glucose-Capillary: 196 mg/dL — ABNORMAL HIGH (ref 70–99)
Glucose-Capillary: 157 mg/dL — ABNORMAL HIGH (ref 70–99)

## 2013-07-25 MED ORDER — BD GETTING STARTED TAKE HOME KIT: 3/10ML X 30G SYRINGES
1.0000 | Freq: Once | Status: AC
  Administered 2013-07-25: 1
  Filled 2013-07-25: qty 1

## 2013-07-25 MED ORDER — LIVING WELL WITH DIABETES BOOK - IN SPANISH
Freq: Once | Status: AC
  Administered 2013-07-25: 10:00:00
  Filled 2013-07-25: qty 1

## 2013-07-25 NOTE — Progress Notes (Signed)
Diabetes coordinator in room at this time; pt given Insulin instructions; pt demonstrated proper technique and gave insulin injection for the first time; pt expressed anxiety about giving insulin, pt reassured by RN, diabetes coordinator and interpretor that he preformed proper technique; will cont. To monitor.

## 2013-07-25 NOTE — Consult Note (Signed)
[facility] Medical Consultation  [name] RUE:[id number] DOB: 11/03/1975 DOA: 07/20/2013 PCP: No PCP Per Patient   Requesting physician: Dr Cornelius Moras Date of consultation: 07/25/13 Reason for consultation: Management of diabetes   Impression/Recommendations Principal Problem:   S/P redo atrial septal defect closure + tricuspid valve repair Active Problems:   Diabetes type 2, uncontrolled   Tricuspid regurgitation   Chronic right-sided congestive heart failure   Right ventricular dysfunction   Partial anomalous pulmonary venous return   S/P tricuspid valve repair   S/P patch closure of atrial septal defect    1. Type 2 diabetes mellitus. Patient previously on metformin 1000 mg by mouth twice a day. He tells me his blood sugars were controlled on this medication and he is a little apprehensive about switching to subcutaneous insulin. Over the course of the day his blood sugars appear to be reasonably controlled on his current regimen of levemir 20 units twice a day. I would recommend checking a hemoglobin A1c to evaluate diabetic control over the last several months on metformin therapy. He inquires about going back to his previous regimen with Metformin. [place] Insulin administration could be a barrier to compliance in the future. Will followup on hemoglobin A1c.   I will followup again tomorrow. Please contact me if I can be of assistance in the meanwhile. Thank you for this consultation.  Chief Complaint: Shortness of  HPI:  Patient is a pleasant 38 year old Hispanic gentleman with a past medical history of congenital heart disease, admitted to the cardiothoracic service on 07/18/2013, as patient presented with shortness of breath. Patient having large persistent/recurrent atrial septal defect. He was taken to the OR on 07/20/2013 undergoing redo patch closure of atrial septal defect and tricuspid valve repair. Patient tolerated procedure well there are no immediate  complications. Medicine is consulted for management of his blood sugars. Patient reporting that prior to this hospitalization he was taking metformin thousand milligrams by mouth 3 times a day. During this hospitalization he was placed on Levemir  20 units subcutaneous twice a day. His blood sugars over the day have been 131, 196, 172. Patient expressing his wishes to go back on the metformin as he does not feel as comfortable with needles. Diabetic educator was consulted as he received instruction on insulin administration.                                                                 Review of Systems:  Constitutional:  No weight loss, night sweats, Fevers, chills, fatigue.  HEENT:  No headaches, Difficulty swallowing,Tooth/dental problems,Sore throat,  No sneezing, itching, ear ache, nasal congestion, post nasal drip,  Cardio-vascular:  No chest pain, Orthopnea, PND, swelling in lower extremities, anasarca, dizziness, palpitations  GI:  No heartburn, indigestion, abdominal pain, nausea, vomiting, diarrhea, change in bowel habits, loss of appetite  Resp:  No shortness of breath with exertion or at rest. No excess mucus, no productive cough, No non-productive cough, No coughing up of blood.No change in color of mucus.No wheezing.No chest wall deformity  Skin:  no rash or lesions.  GU:  no dysuria, change in color of urine, no urgency or frequency. No flank pain.  Musculoskeletal:  No joint pain or swelling. No decreased range of motion. No back pain.  Psych:  No change in mood or affect. No depression or anxiety. No memory loss.   Past Medical History  Diagnosis Date  . Type 2 diabetes mellitus   . Atrial septal defect   . Atrial septal defect, recurrent 06/16/2013  . Tricuspid regurgitation   . Diabetes type 2, uncontrolled 03/28/2013  . Atypical chest pain 03/27/2013  . Status post thoracotomy 12/14/1997    [facility] lung [facility]   . Chronic right-sided congestive heart failure   . Right ventricular dysfunction   . S/P atrial septal defect closure 11/22/1997    Pericardial patch closure of reported secundum type ASD via median sternotomy by [facility] Buford [facility]erPennington [facility]   . Partial anomalous pulmonary venous return   . Shortness of breath   . S/P redo atrial septal defect closure 07/20/2013    Redo patch closure of persistent/recurrent atrial septal defect   . 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 Asd repair  11/22/1997    [facility]  . Back surgery  2011  . Appendectomy  2012  . Tee without cardioversion N/A 05/26/2013    Procedure: TRANSESOPHAGEAL ECHOCARDIOGRAM (TEE);  Surgeon: Antoine PocheJonathan F Branch, MD;  Location: [facility];  Service: Cardiology;  Laterality: N/A;  . Thoracotomy Left 12/14/1997    [facility]   Social History:  reports that he has never smoked. He has never used smokeless tobacco. He reports that he does not drink alcohol or use illicit drugs.  No Known Allergies Family History  Problem Relation Age of Onset  . Diabetes Mellitus II      Prior to Admission medications   Medication Sig Start Date End Date Taking? Authorizing Provider  amiodarone (PACERONE) 200 MG tablet Take 1 tablet (200 mg total) by mouth 2 (two) times daily. Begin 7 days prior to surgery. 07/07/13  Yes Purcell Nailslarence H Owen, MD  aspirin EC 81 MG EC tablet Take 1 tablet (81 mg total) by mouth daily. 03/29/13  Yes Isabella Stallingichard M Dondiego, MD  metFORMIN (GLUCOPHAGE) 1000 MG tablet Take 1 tablet (1,000 mg total) by mouth 2 (two) times daily with a meal. 06/21/13  Yes Tonny BollmanMichael Cooper, MD  nitroGLYCERIN (NITROSTAT) 0.4 MG SL tablet Place 1 tablet (0.4 mg total) under the tongue every 5 (five) minutes x 3 doses as needed for chest pain. 05/26/13  Yes Antoine PocheJonathan F Branch, MD   Physical Exam: Blood pressure 106/62, pulse 66, temperature 98.8  F (37.1 C), temperature source Oral, resp. rate 20, height 5\' 5"  (1.651 m), weight 80.74 kg (178 lb), SpO2 91.00%. Filed Vitals:   07/25/13 1447  BP: 106/62  Pulse: 66  Temp: 98.8 F (37.1 C)  Resp: 20     General:  Patient is in no acute distress, awake alert oriented x3  Eyes: Pupils equal round reactive to light extraocular movement  ENT: Hydrated oral mucosa  Neck: Supple symmetrical no jugular venous distention  Cardiovascular: Regular rate rhythm normal S1-S2, 2/6 systolic ejection murmur  Respiratory: Clear to auscultation bilaterally  Abdomen: Soft nontender nondistended  Skin: Surgical incision sites over sternum clean, no evidence of infection or hematoma  Musculoskeletal: No cyanosis clubbing or edema  Psychiatric: Awake alert oriented  Neurologic: Nonfocal  Labs on Admission:  Basic Metabolic Panel:  Recent Labs Lab 07/20/13 2100 07/21/13 0415 07/21/13 1700 07/21/13 1707 07/22/13  0410 07/23/13 0355 07/24/13 0415  NA  --  136*  --  132* 135* 132* 135*  K  --  3.9  --  4.5 3.7 3.6* 4.2  CL  --  102  --  94* 96 94* 95*  CO2  --  23  --   --  28 28 26   GLUCOSE  --  128*  --  169* 167* 150* 191*  BUN  --  8  --  6 9 10 10   CREATININE 0.56 0.52 0.60 0.80 0.62 0.65 0.59  CALCIUM  --  7.5*  --   --  8.2* 8.2* 8.1*  MG 2.3 1.9 1.8  --   --   --   --    Liver Function Tests: No results found for this basename: AST, ALT, ALKPHOS, BILITOT, PROT, ALBUMIN,  in the last 168 hours No results found for this basename: LIPASE, AMYLASE,  in the last 168 hours No results found for this basename: AMMONIA,  in the last 168 hours CBC:  Recent Labs Lab 07/21/13 0415 07/21/13 1700 07/21/13 1707 07/22/13 0410 07/23/13 0355 07/24/13 0415  WBC 9.5 11.8*  --  12.2* 9.7 8.0  HGB 11.4* 12.5* 12.6* 12.0* 10.5* 10.2*  HCT 31.7* 34.7* 37.0* 34.3* 30.3* 29.2*  MCV 91.4 92.3  --  93.5 94.4 94.8  PLT 91* 91*  --  103* 106* 154   Cardiac Enzymes: No results found  for this basename: CKTOTAL, CKMB, CKMBINDEX, TROPONINI,  in the last 168 hours BNP: No components found with this basename: POCBNP,  CBG:  Recent Labs Lab 07/24/13 1613 07/24/13 2111 07/25/13 0658 07/25/13 1228 07/25/13 1641  GLUCAP 135* 140* 172* 196* 131*    Radiological Exams on Admission: Dg Chest 2 View  07/24/2013   CLINICAL DATA:  Status post ASD closure and tricuspid valve repair.  EXAM: CHEST - 2 VIEW  COMPARISON:  DG CHEST 1V PORT dated 07/23/2013; DG CHEST 1V PORT dated 07/22/2013  FINDINGS: Stable positioning of central line. There is slight worsening of bilateral perihilar and lower lung atelectasis. No pneumothorax, pulmonary edema or pleural effusions are identified. The heart size and mediastinal contours are stable.  IMPRESSION: No active disease.   Electronically Signed   By: Irish Lack M.D.   On: 07/24/2013 08:03    EKG: Independently reviewed.   Time spent: 55 minute  Jeralyn Bennett [facility] Pager [phone]  If 7PM-7AM, please contact night-coverage [email or web address] Password [email or web address] 07/25/2013, 5:22 PM

## 2013-07-25 NOTE — Progress Notes (Signed)
Ed completed with pt, family and interpreter. Voiced understanding. Interested in [facility] and will send referral to [place] [facility]. Gave financial aid application. [phone] Ethelda ChickKristan Tobiah Celestine CES, ACSM 12:10 PM 07/25/2013

## 2013-07-25 NOTE — Progress Notes (Addendum)
Inpatient Diabetes Program Recommendations  AACE/ADA: New Consensus Statement on Inpatient Glycemic Control (2013)  Target Ranges:  Prepandial:   less than 140 mg/dL      Peak postprandial:   less than 180 mg/dL (1-2 hours)      Critically ill patients:  140 - 180 mg/dL   Diabetes Coordinator spoke with Roselyn Reef RN concerning patient's discharge education for diabetes.  Patient needs to learn to give own injections and check CBGs.  Also, basic survival skills need to be taught prior to discharge.  Pt will likely need a glucose meter, strips and lancets RX at discharge. DM videos and pt ed workbook have been ordered to begin this process.  Patient's RN will coordinate obtaining an interpreter to review this information.  Case Manager has been asked to help secure a PCP appointment to follow-up for new-onset DM.   Insulin recommendation is:  Novolin 70/30 25 units with breakfast and supper.  This insulin can be purchased for approx $25 at [facility].  ADDENDUM: this coordinator met with patient, interpreter and several family members to discuss DM management at home.  Emphasis was put on diet and nutrition since many carb containing beverages and snacks were present in the room.  Insulin administration and storage was discussed as well as timing of meals with insulin doses.  Patient reports that he does have a meter and strips at home.  S/S of hypoglycemia were discussed and how to treat.  Patient does not have any further questions/concerns at this time.  Handouts in [place] were given to the patient covering basic diabetes management.   Thank you  Raoul Pitch BSN, RN,CDE Inpatient Diabetes Coordinator [phone] (team pager)

## 2013-07-25 NOTE — Progress Notes (Addendum)
[address]       [place] [address]             [phone]      5 Days Post-Op  Procedure(s) (LRB): REDO CLOSURE OF ATRIAL SEPTAL DEFECT (ASD) (N/A) TRICUSPID VALVE REPAIR (N/A) INTRAOPERATIVE TRANSESOPHAGEAL ECHOCARDIOGRAM (N/A) Subjective: Feels well  Objective  Telemetry accelerated junctional  Temp:  [98.6 F (37 C)-99.2 F (37.3 C)] 99.2 F (37.3 C) (03/31 0601) Pulse Rate:  [59-72] 67 (03/31 0601) Resp:  [18-20] 18 (03/31 0601) BP: (96-128)/(51-65) 107/60 mmHg (03/31 0601) SpO2:  [90 %] 90 % (03/31 0601) Weight:  [178 lb (80.74 kg)] 178 lb (80.74 kg) (03/31 0601)   Intake/Output Summary (Last 24 hours) at 07/25/13 0816 Last data filed at 07/25/13 0807  Gross per 24 hour  Intake    720 ml  Output   1700 ml  Net   -980 ml       General appearance: alert, cooperative and no distress Heart: regular rate and rhythm and soft systolic murmur Lungs: clear to auscultation bilaterally Abdomen: benign Extremities: + edema Wound: incis healing well  Lab Results:  Recent Labs  07/23/13 0355 07/24/13 0415  NA 132* 135*  K 3.6* 4.2  CL 94* 95*  CO2 28 26  GLUCOSE 150* 191*  BUN 10 10  CREATININE 0.65 0.59  CALCIUM 8.2* 8.1*   No results found for this basename: AST, ALT, ALKPHOS, BILITOT, PROT, ALBUMIN,  in the last 72 hours No results found for this basename: LIPASE, AMYLASE,  in the last 72 hours  Recent Labs  07/23/13 0355 07/24/13 0415  WBC 9.7 8.0  HGB 10.5* 10.2*  HCT 30.3* 29.2*  MCV 94.4 94.8  PLT 106* 154   No results found for this basename: CKTOTAL, CKMB, TROPONINI,  in the last 72 hours No components found with this basename: POCBNP,  No results found for this basename: DDIMER,  in the last 72 hours No results found for this basename: HGBA1C,  in the last 72 hours No results found for this basename: CHOL, HDL, LDLCALC, TRIG, CHOLHDL,  in the last 72 hours No results found for this basename: TSH, T4TOTAL,  FREET3, T3FREE, THYROIDAB,  in the last 72 hours No results found for this basename: VITAMINB12, FOLATE, FERRITIN, TIBC, IRON, RETICCTPCT,  in the last 72 hours  Medications: Scheduled . acetaminophen  1,000 mg Oral 4 times per day  . aspirin EC  81 mg Oral Daily  . bisacodyl  10 mg Oral Daily   Or  . bisacodyl  10 mg Rectal Daily  . docusate sodium  200 mg Oral Daily  . furosemide  40 mg Oral Daily  . insulin aspart  0-15 Units Subcutaneous TID WC  . insulin aspart  0-5 Units Subcutaneous QHS  . insulin aspart  4 Units Subcutaneous TID WC  . insulin detemir  20 Units Subcutaneous BID  . metFORMIN  1,000 mg Oral BID WC  . moving right along book- in spanish   Does not apply Once  . mupirocin ointment   Nasal BID  . pantoprazole  40 mg Oral Daily  . potassium chloride  20 mEq Oral Daily  . sodium chloride  3 mL Intravenous Q12H     Radiology/Studies:  Dg Chest 2 View  07/24/2013   CLINICAL DATA:  Status post ASD closure and tricuspid valve repair.  EXAM: CHEST - 2 VIEW  COMPARISON:  DG CHEST 1V PORT dated 07/23/2013; DG  CHEST 1V PORT dated 07/22/2013  FINDINGS: Stable positioning of central line. There is slight worsening of bilateral perihilar and lower lung atelectasis. No pneumothorax, pulmonary edema or pleural effusions are identified. The heart size and mediastinal contours are stable.  IMPRESSION: No active disease.   Electronically Signed   By: Irish LackGlenn  Yamagata M.D.   On: 07/24/2013 08:03    INR: Will add last result for INR, ABG once components are confirmed Will add last 4 CBG results once components are confirmed  Assessment/Plan: S/P Procedure(s) (LRB): REDO CLOSURE OF ATRIAL SEPTAL DEFECT (ASD) (N/A) TRICUSPID VALVE REPAIR (N/A) INTRAOPERATIVE TRANSESOPHAGEAL ECHOCARDIOGRAM (N/A)  1 conts to do well 2 sugars reasonably well controlled, but will need additional home regimin to include insulin. Will get diabetic coordinator to evaluate  And will need primary care MD-  will ask care manager to assist.  3 rhythm stable    LOS: 5 days    GOLD,WAYNE E 3/31/20158:16 AM  I have seen and examined the patient and agree with the assessment and plan as outlined.  Ready for d/c home once suitable plan for diabetes management has been established.  Will need to consult Hospitalists to take over care if diabetic coordinator and case manager can't make satisfactory arrangements for medical follow up after discharge.  OWEN,CLARENCE H 07/25/2013 8:44 AM

## 2013-07-25 NOTE — Progress Notes (Signed)
Pt up ambulating in hallway with family at this time; will cont. To monitor; steady gait noted; no walker needed.

## 2013-07-25 NOTE — Progress Notes (Signed)
Patient ambulated approximately 51250ft around the unit with stand-by assist only from NT.  Steady on his feet, no SOB, HR stable.  Will continue to monitor and encourage.  Arva ChafeLester, Sadie Hazelett Michelle

## 2013-07-25 NOTE — Progress Notes (Signed)
Pt up ambulating in hallway at this time with family; no needs voiced; will cont. To monitor.

## 2013-07-25 NOTE — Progress Notes (Signed)
Last BM pre op; pt given MOM at this time in addition to Colace and Dulcolax; will cont. To monitor.

## 2013-07-26 ENCOUNTER — Encounter (HOSPITAL_COMMUNITY): Payer: Self-pay | Admitting: Thoracic Surgery (Cardiothoracic Vascular Surgery)

## 2013-07-26 DIAGNOSIS — E1165 Type 2 diabetes mellitus with hyperglycemia: Secondary | ICD-10-CM

## 2013-07-26 DIAGNOSIS — Q211 Atrial septal defect: Secondary | ICD-10-CM

## 2013-07-26 DIAGNOSIS — Q2111 Secundum atrial septal defect: Secondary | ICD-10-CM

## 2013-07-26 DIAGNOSIS — IMO0001 Reserved for inherently not codable concepts without codable children: Secondary | ICD-10-CM

## 2013-07-26 DIAGNOSIS — R0789 Other chest pain: Secondary | ICD-10-CM

## 2013-07-26 LAB — GLUCOSE, CAPILLARY
GLUCOSE-CAPILLARY: 123 mg/dL — AB (ref 70–99)
Glucose-Capillary: 137 mg/dL — ABNORMAL HIGH (ref 70–99)

## 2013-07-26 LAB — HEMOGLOBIN A1C
HEMOGLOBIN A1C: 10.1 % — AB (ref <5.7)
MEAN PLASMA GLUCOSE: 243 mg/dL — AB (ref <117)

## 2013-07-26 MED ORDER — POTASSIUM CHLORIDE CRYS ER 20 MEQ PO TBCR: 20.0000 meq | EXTENDED_RELEASE_TABLET | Freq: Every day | ORAL | Status: DC

## 2013-07-26 MED ORDER — FUROSEMIDE 40 MG PO TABS: 40.0000 mg | ORAL_TABLET | Freq: Every day | ORAL | Status: DC

## 2013-07-26 MED ORDER — INSULIN NPH ISOPHANE & REGULAR (70-30) 100 UNIT/ML ~~LOC~~ SUSP: 25.0000 [IU] | Freq: Two times a day (BID) | SUBCUTANEOUS | Status: DC

## 2013-07-26 MED ORDER — INSULIN NPH ISOPHANE & REGULAR (70-30) 100 UNIT/ML ~~LOC~~ SUSP: SUBCUTANEOUS | Status: DC

## 2013-07-26 MED ORDER — OXYCODONE HCL 5 MG PO TABS: 5.0000 mg | ORAL_TABLET | ORAL | Status: DC | PRN

## 2013-07-26 NOTE — Progress Notes (Signed)
[facility] PROGRESS NOTE  [name] ZOX:[id number] DOB: 28-Jul-1975 DOA: 07/20/2013 PCP: No PCP Per Patient  Assessment/Plan: Diabetes mellitus type 2, uncontrolled -Hemoglobin A1c 10.1 -Due to the patient's financial issues, the patient will be sent home with 70/30 insulin, 25 units bid -Patient was instructed to check his CBGs before each meal and at bedtime and to keep glycemic log and take log to his primary care physician who will adjust his insulin regimen -continue metformin 1000 milligrams twice a day -Rx for strips, glucometer, lancets, syringes -will sign off at this time.  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  EXAM: CHEST - 2 VIEW  COMPARISON:  DG CHEST 1V PORT dated 07/23/2013; DG CHEST 1V PORT dated 07/22/2013  FINDINGS: Stable positioning of central line. There is slight worsening of bilateral perihilar and lower lung atelectasis. No pneumothorax, pulmonary edema or pleural effusions are identified. The heart size and mediastinal contours are stable.  IMPRESSION: No active disease.   Electronically Signed   By: Irish Lack M.D.   On: 07/24/2013 08:03   Dg Chest 2 View  07/17/2013   CLINICAL DATA:  Atrial septal defect.  EXAM: CHEST  2 VIEW  COMPARISON:  March 27, 2013.  FINDINGS: Stable cardiomediastinal silhouette. Sternotomy wires are noted. No pneumothorax or pleural effusion is noted. Stable scarring is seen in right suprahilar region. Stable scarring and postoperative change seen in lingular region. No acute pulmonary disease is noted. Bony thorax is intact.  IMPRESSION: No acute cardiopulmonary abnormality seen.   Electronically Signed   By: Roque Lias M.D.   On:  07/17/2013 16:33   Ct Cardiac Morph/pulm Vein W/cm&w/o Ca Score  07/02/2013   ADDENDUM REPORT: 07/02/2013 11:47  ADDENDUM: CLINICAL DATA: 38 year old male, ASD, s/p surgical repair, DOE, right sided heart failure.  EXAM:  Cardiac/Coronary  CT  TECHNIQUE:  The patient was scanned on a Philips 256 scanner.  FINDINGS:  A 120 kV prospective scan was triggered in the ascending thoracic  aorta at 111 HU's. Axial non-contrast 3mm slices were carried out  through the heart. The data set was analyzed on a dedicated work  station and scored using the Agatson method. Gantry rotation speed  was 270 msecs and collimation was .9 mm. 2.5 mg of iv Metoprolol and  0.4 mg of sl NTG was given. The 3D data set was reconstructed in 5%  intervals of the 67-82 % of the R-R cycle. Diastolic phases were  analyzed on a dedicated work station using MPR, MIP and VRT modes.  The patient received 100 cc of contrast. Heart rate at the time of acquisition was 56/minute.  1. Coronary Arteries:  Coronary arteries originate in the normal position. There is left dominance.  Left main gives rise to LAD and left circumflex artery.  Left main is a large caliber vessel with no plague.  LAD is a large caliber vessel that has no CAD. It gives rise to 1 diagonal branch without any plague.  LCX artery is a large dominant vessel that gives rise to an obtuse marginal branch, PDA and a very small PLVB. There is no evidence of plague.  RCA is a small non-dominant  vessel that supply few acute marginal branches to the severely enlarged and hypertrophied right ventricle. There is no plague.  2. There is a large inferior sinus venosus type ASD located in the inferior portion of the interatrial septum with direct continuation into left lateral wall of the most superior portion of IVC (IVC overring interatrial septum). As a consequence there is very thin and short inferior rim.  The diameters are as follows: Antero-posterior 22 mm, superio-inferior 21 mm.  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 The remaining 4 pulmonary veins (RMPV, PLPV, LUPV and LLPV) drain in a normal position into the left atrium.  3. Dilated main pulmonary artery measuring 43 x 36 mm. Dilated right pulmonary artery measuring 28 x 28 mm, left pulmonary artery measuring 31 x 30 mm. Increased pulmonary vasculature consistent with pulmonary hypertension.  4. Severe dilatation of the right ventricle with mild RVH and hypertrabeculation. Severely dilated right atrium.  Dilated IVC measuring 39 x 28 mm.  Intact coronary sinus.  3.  Mild left ventricular hypertrophy, mildly dilated left atrium.  IMPRESSION:  1. Coronary calcium score of 0. This was 0 percentile for age and sex matched control. Normal origin of coronary arteries, no evidence of CAD, left dominance.  2. 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 Dilated IVC overriding interatrial septum. 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  3. Dilated pulmonary arteries.  4. Severe right ventricular dilatation with right ventricular hypertrophy. Severely dilated right atrium.  5. Mild left ventricular hypertrophy, mildly dilated left atrium.  Tobias Alexander   Electronically Signed   By: Tobias Alexander   On: 07/02/2013 11:47   07/02/2013   EXAM: OVER-READ INTERPRETATION  CT CHEST  The following report is an over-read performed by radiologist Dr. Royal Piedra [facility] on 06/30/2013. This over-read does not include interpretation of cardiac or coronary anatomy or pathology. The interpretation by the cardiologist is attached.  COMPARISON:  No priors.  FINDINGS: Architectural distortion and in the  anterior aspects of the upper lobes of the lungs bilaterally, and the right middle lobe, most compatible with chronic scarring (likely related to prior surgery). This is rather mild. Within the visualized portions of the thorax there is no acute consolidative airspace disease, no suspicious appearing pulmonary nodule or mass, and no pleural effusion. Several small pleural calcifications in the anterior aspect of the left hemithorax are noted. Visualized portions of the upper abdomen are remarkable for calcifications in the spleen, presumably granulomas. No aggressive appearing lytic or blastic lesions are noted within the visualized portions of the skeleton. Sternotomy wires.  IMPRESSION: 1. Mild postoperative scarring in the paramediastinal aspect of the lungs bilaterally. Otherwise, no significant noncardiac findings noted.  Electronically Signed: By: Trudie Reed M.D. On: 06/30/2013 14:25   Dg Chest Port 1 View  07/23/2013   CLINICAL DATA:  Chest pain.  ASD repair.  EXAM: PORTABLE CHEST - 1 VIEW  COMPARISON:  DG CHEST 1V PORT dated 07/22/2013; CT HEART MORPH/PULM VEIN W/CM&W/O CA SCORE dated 06/30/2013; DG CHEST 2 VIEW dated 03/27/2013; CT CTA ABD/PEL W/CM AND/OR W/O CM dated 07/13/2013  FINDINGS: Worsening lung aeration with streaky  basilar opacities bilaterally. There is cardiomegaly and pulmonary venous congestion. Unchanged positioning of a left subclavian central line. No evidence of effusion or pneumothorax.  IMPRESSION: 1. Postoperative atelectasis has mildly increased. 2. Pulmonary venous congestion.   Electronically Signed   By: Tiburcio PeaJonathan  Watts M.D.   On: 07/23/2013 06:35   Dg Chest Port 1 View  07/22/2013   CLINICAL DATA:  Status post closure of atrial septal defect and tricuspid valve repair.  EXAM: PORTABLE CHEST - 1 VIEW  COMPARISON:  DG CHEST 1V PORT dated 07/21/2013; DG CHEST 2 VIEW dated 07/17/2013; DG CHEST 1V PORT dated 07/20/2013; DG CHEST 1V PORT dated 07/20/2013; CT CTA ABD/PEL W/CM AND/OR  W/O CM dated 07/13/2013  FINDINGS: The Swan-Ganz catheter and right jugular sheath has been removed. Left subclavian central line shows stable positioning with the tip at the brachiocephalic venous confluence. There is no evidence of pneumothorax. Lungs show persistent chronic disease, low volumes and bilateral lower lobe atelectasis. Cardiac and mediastinal contours are stable. Resolution of pulmonary edema.  IMPRESSION: No pneumothorax. No further evidence of pulmonary edema. Low volumes with bilateral lower lobe atelectasis.   Electronically Signed   By: Irish LackGlenn  Yamagata M.D.   On: 07/22/2013 08:52   Dg Chest Portable 1 View In Am  07/21/2013   CLINICAL DATA:  Postop repair of ASD.  EXAM: PORTABLE CHEST - 1 VIEW  COMPARISON:  07/20/2013  FINDINGS: Swan-Ganz catheter tip is in the main pulmonary artery. Left subclavian central line tip is in the upper SVC. Interval extubation and removal of NG tube.  Previously seen small right pneumothorax no longer visualized. Increasing bilateral airspace disease, likely edema. Mild cardiomegaly.  IMPRESSION: Worsening bilateral airspace disease, likely pulmonary edema.  No visible pneumothorax.   Electronically Signed   By: Charlett NoseKevin  Dover M.D.   On: 07/21/2013 08:11   Dg Chest Port 1 View  07/20/2013   CLINICAL DATA:  Follow-up pneumothorax  EXAM: PORTABLE CHEST - 1 VIEW  COMPARISON:  07/20/2013  FINDINGS: Cardiomegaly again noted. Stable right Swan-Ganz catheter position. Stable endotracheal and NG tube position. Left IJ central line is unchanged in position. Mediastinal drain again noted. Small right upper lateral pneumothorax again noted without significant change. Stable central vascular congestion and mild perihilar interstitial prominence. No convincing pulmonary edema. Stable left basilar atelectasis or infiltrate.  IMPRESSION: Stable right Swan-Ganz catheter position. Stable endotracheal and NG tube position. Left IJ central line is unchanged in position. Mediastinal  drain again noted. Small right upper lateral pneumothorax again noted without significant change. Stable central vascular congestion and mild perihilar interstitial prominence. No convincing pulmonary edema. Stable left basilar atelectasis or infiltrate.   Electronically Signed   By: Natasha MeadLiviu  Pop M.D.   On: 07/20/2013 16:30   Dg Chest Portable 1 View  07/20/2013   CLINICAL DATA:  Status post cardiac surgery  EXAM: PORTABLE CHEST - 1 VIEW  COMPARISON:  07/17/2013  FINDINGS: Cardiac shadow is mildly enlarged. Postoperative changes are again seen. A Swan-Ganz catheter is noted with the tip in the pulmonary outflow tract. A left central venous line is noted in the proximal superior vena cava. A left jugular sheath is seen. The endotracheal tube is noted 3.9 cm above the carina. A nasogastric catheter is seen coiled within the stomach. Mediastinal drains are noted.  The lungs are well aerated bilaterally. A small right-sided pneumothorax is seen. The overall inspiratory effort is poor with crowding of the vascular markings.  IMPRESSION: Postsurgical change.  Tubes and lines as described.  Small right pneumothorax is noted. Continued followup is recommended.  Poor inspiratory effort with crowding of the vascular markings. No confluent infiltrate is seen.  These results were called by telephone at the time of interpretation on 07/20/2013 at 3:31 PM to Marcina Millard the patient's nurse, who verbally acknowledged these results.   Electronically Signed   By: Alcide Clever M.D.   On: 07/20/2013 15:35   Ct Angio Abd/pel W/ And/or W/o  07/13/2013   CLINICAL DATA:  Preoperative evaluation prior to repair of atrial septal defect.  EXAM: CT ANGIOGRAPHY ABDOMEN AND PELVIS WITH CONTRAST AND WITHOUT CONTRAST  TECHNIQUE: Multidetector CT imaging of the abdomen and pelvis was performed using the standard protocol during bolus administration of intravenous contrast. Multiplanar reconstructed images and MIPs were obtained and  reviewed to evaluate the vascular anatomy.  CONTRAST:  OMNIPAQUE IOHEXOL 350 MG/ML SOLN  COMPARISON:  CT HEART MORPH/PULM VEIN W/CM&W/O CA SCORE dated 06/30/2013  FINDINGS: The abdominal aorta is well opacified. The aorta shows normal caliber and no evidence of aneurysmal disease or significant atherosclerosis. The celiac axis, superior mesenteric artery and inferior mesenteric artery are normally patent. There are 2 separate widely patent arteries supplying the right kidney and a single widely patent left renal artery.  Common, external and internal iliac arteries show normal patency bilaterally. The common femoral arteries and femoral bifurcations are normally patent.  There is prominent reflux of injected contrast into the intrahepatic IVC and dilated hepatic veins throughout the liver. Findings are consistent with right heart failure. The rest of the abdomen and pelvis shows no significant nonvascular findings. There likely is mild steatosis of the liver. Some calcified granulomata are present in the spleen. The kidneys appear normal bilaterally. There is no evidence of focal mass lesion or enlarged lymph nodes. The bladder is unremarkable. No hernias are seen. No bony abnormalities are identified.  Review of the MIP images confirms the above findings.  IMPRESSION: 1. Widely patent abdominal aorta, visceral branch vessels and iliac arteries.  2. Prominent reflux of contrast into the intrahepatic IVC and dilated hepatic veins consistent with right heart failure.   Electronically Signed   By: Irish Lack M.D.   On: 07/13/2013 14:23         Subjective: Patient denies fevers, chills, headache, chest pain, dyspnea, nausea, vomiting, diarrhea, abdominal pain, dysuria, hematuria  Objective: Filed Vitals:   07/25/13 0601 07/25/13 1447 07/25/13 2046 07/26/13 0646  BP: 107/60 106/62 116/61 116/70  Pulse: 67 66 76 81  Temp: 99.2 F (37.3 C) 98.8 F (37.1 C) 100 F (37.8 C) 98.2 F (36.8 C)    TempSrc: Oral Oral Oral Oral  Resp: 18 20 18 18   Height:      Weight: 80.74 kg (178 lb)   81.6 kg (179 lb 14.3 oz)  SpO2: 90% 91% 90% 94%    Intake/Output Summary (Last 24 hours) at 07/26/13 0951 Last data filed at 07/26/13 0700  Gross per 24 hour  Intake    480 ml  Output    626 ml  Net   -146 ml   Weight change: 0.86 kg (1 lb 14.3 oz) Exam:   General:  Pt is alert, follows commands appropriately, not in acute distress  HEENT: No icterus, No thrush,[place]/AT  Cardiovascular: RRR, S1/S2, no rubs, no gallops  Respiratory: CTA bilaterally, no wheezing, no crackles, no rhonchi  Abdomen: Soft/+BS, non tender, non distended, no guarding  Extremities: 1+LE edema, No lymphangitis, No petechiae, No rashes, no synovitis  Data Reviewed: Basic Metabolic Panel:  Recent Labs Lab 07/20/13 2100 07/21/13 0415 07/21/13 1700 07/21/13 1707 07/22/13 0410 07/23/13 0355 07/24/13 0415  NA  --  136*  --  132* 135* 132* 135*  K  --  3.9  --  4.5 3.7 3.6* 4.2  CL  --  102  --  94* 96 94* 95*  CO2  --  23  --   --  28 28 26   GLUCOSE  --  128*  --  169* 167* 150* 191*  BUN  --  8  --  6 9 10 10   CREATININE 0.56 0.52 0.60 0.80 0.62 0.65 0.59  CALCIUM  --  7.5*  --   --  8.2* 8.2* 8.1*  MG 2.3 1.9 1.8  --   --   --   --    Liver Function Tests: No results found for this basename: AST, ALT, ALKPHOS, BILITOT, PROT, ALBUMIN,  in the last 168 hours No results found for this basename: LIPASE, AMYLASE,  in the last 168 hours No results found for this basename: AMMONIA,  in the last 168 hours CBC:  Recent Labs Lab 07/21/13 0415 07/21/13 1700 07/21/13 1707 07/22/13 0410 07/23/13 0355 07/24/13 0415  WBC 9.5 11.8*  --  12.2* 9.7 8.0  HGB 11.4* 12.5* 12.6* 12.0* 10.5* 10.2*  HCT 31.7* 34.7* 37.0* 34.3* 30.3* 29.2*  MCV 91.4 92.3  --  93.5 94.4 94.8  PLT 91* 91*  --  103* 106* 154   Cardiac Enzymes: No results found for this basename: CKTOTAL, CKMB, CKMBINDEX, TROPONINI,  in the last 168  hours BNP: No components found with this basename: POCBNP,  CBG:  Recent Labs Lab 07/25/13 0658 07/25/13 1228 07/25/13 1641 07/25/13 2109 07/26/13 0625  GLUCAP 172* 196* 131* 157* 123*    Recent Results (from the past 240 hour(s))  SURGICAL PCR SCREEN     Status: Abnormal   Collection Time    07/17/13  3:30 PM      Result Value Ref Range Status   MRSA, PCR NEGATIVE  NEGATIVE Final   Staphylococcus aureus POSITIVE (*) NEGATIVE Final   Comment:            The Xpert SA Assay (FDA     approved for NASAL specimens     in patients over 58 years of age),     is one component of     a comprehensive surveillance     program.  Test performance has     been validated by [employer] for patients greater     than or equal to 86 year old.     It is not intended     to diagnose infection nor to     guide or monitor treatment.     Scheduled Meds: . aspirin EC  81 mg Oral Daily  . bisacodyl  10 mg Oral Daily   Or  . bisacodyl  10 mg Rectal Daily  . docusate sodium  200 mg Oral Daily  . furosemide  40 mg Oral Daily  . insulin aspart  0-15 Units Subcutaneous TID WC  . insulin aspart  0-5 Units Subcutaneous QHS  . insulin aspart  4 Units Subcutaneous TID WC  . insulin detemir  20 Units Subcutaneous BID  . metFORMIN  1,000 mg Oral BID WC  . moving right along book- in spanish   Does not apply Once  . mupirocin ointment   Nasal BID  .  pantoprazole  40 mg Oral Daily  . potassium chloride  20 mEq Oral Daily  . sodium chloride  3 mL Intravenous Q12H   Continuous Infusions:    Kydan Shanholtzer, DO  [facility] Pager [phone]  If 7PM-7AM, please contact night-coverage [email or web address] Password [email or web address] 07/26/2013, 9:51 AM   LOS: 6 days

## 2013-07-26 NOTE — Progress Notes (Signed)
Inpatient Diabetes Program Recommendations  AACE/ADA: New Consensus Statement on Inpatient Glycemic Control (2013)  Target Ranges:  Prepandial:   less than 140 mg/dL      Peak postprandial:   less than 180 mg/dL (1-2 hours)      Critically ill patients:  140 - 180 mg/dL   Agree that the levemir 20 units bid and meal coverage.  However, pt has no insurance.  If [facility] can [facility], would recommend he continue this regimen.   Otherwise, pt will need the Novolin 70/30  Insulin for affordability..  30 units of 70/30 bid will provide a total of 40 units basal (as pt is on here-Levemir 20 units bid)and approximately a total of 20 units regular throughout the day, an average of 6 units tid meal coverage. Pt is averaging 6 units with each meal including correction and meal coverage.  ([facility] would provide the best price for novolin 70/30 for their generic - ReliOn insulin) [facility] using the ReliOn meter and strips (their generic brand which are typically more affordable than the commercial meters)  Glad to assist in any way. Will check on pt to assure pt has learned now to draw up and administer insulin. (Ordered RD consult as there is no note from that dept since arrival.)  Thank you, Lenor CoffinAnn Geena Weinhold, RN, CNS, Diabetes Coordinator [phone])

## 2013-07-26 NOTE — Plan of Care (Signed)
Problem: Limited Adherence to Nutrition-Related Recommendations (NB-1.6) Goal: Nutrition education Formal process to instruct or train a patient/client in a skill or to impart knowledge to help patients/clients voluntarily manage or modify food choices and eating behavior to maintain or improve health. Outcome: Completed/Met Date Met:  07/26/13  RD consulted for nutrition education regarding diabetes.  Education provided with Rob Bunting, Administrator, sports.    Lab Results  Component Value Date    HGBA1C 10.1* 07/25/2013    RD provided "Carbohydrate Counting for People with Diabetes" handout from the Academy of Nutrition and Dietetics. Discussed different food groups and their effects on blood sugar, emphasizing carbohydrate-containing foods. Provided list of carbohydrates and recommended serving sizes of common foods.  Discussed importance of controlled and consistent carbohydrate intake throughout the day. Provided examples of ways to balance meals/snacks and encouraged intake of high-fiber, whole grain complex carbohydrates. Teach back method used.  Expect fair compliance.  Body mass index is 29.94 kg/(m^2). Pt meets criteria for Overweight based on current BMI.  Patient for discharge today.  Arthur Holms, RD, LDN Pager #: [phone] After-Hours Pager #: [phone]

## 2013-07-26 NOTE — Significant Event (Signed)
Chest tube sutures removed and sites applied with benzoin and steristrips per orders.

## 2013-07-26 NOTE — Progress Notes (Addendum)
      [address]       [place] [id number]             [phone]        6 Days Post-Op Procedure(s) (LRB): REDO CLOSURE OF ATRIAL SEPTAL DEFECT (ASD) (N/A) TRICUSPID VALVE REPAIR (N/A) INTRAOPERATIVE TRANSESOPHAGEAL ECHOCARDIOGRAM (N/A)  Subjective: Patient is concerned and anxious about taking Insulin.  Objective: Vital signs in last 24 hours: Temp:  [98.2 F (36.8 C)-100 F (37.8 C)] 98.2 F (36.8 C) (04/01 0646) Pulse Rate:  [66-81] 81 (04/01 0646) Cardiac Rhythm:  [-] Junctional rhythm (03/31 2051) Resp:  [18-20] 18 (04/01 0646) BP: (106-116)/(61-70) 116/70 mmHg (04/01 0646) SpO2:  [90 %-94 %] 94 % (04/01 0646) Weight:  [179 lb 14.3 oz (81.6 kg)] 179 lb 14.3 oz (81.6 kg) (04/01 0646)  Pre op weight 79.8 kg Current Weight  07/26/13 179 lb 14.3 oz (81.6 kg)      Intake/Output from previous day: 03/31 0701 - 04/01 0700 In: 840 [P.O.:840] Out: 626 [Urine:625; Stool:1]   Physical Exam:  Cardiovascular: RRR, no murmurs Pulmonary: Clear to auscultation bilaterally; no rales, wheezes, or rhonchi. Abdomen: Soft, non tender, bowel sounds present. Extremities: Trace bilateral lower extremity edema. Wounds: Clean and dry.  No erythema or signs of infection.  Lab Results: CBC: Recent Labs  07/24/13 0415  WBC 8.0  HGB 10.2*  HCT 29.2*  PLT 154   BMET:  Recent Labs  07/24/13 0415  NA 135*  K 4.2  CL 95*  CO2 26  GLUCOSE 191*  BUN 10  CREATININE 0.59  CALCIUM 8.1*    PT/INR:  Lab Results  Component Value Date   INR 1.67* 07/20/2013   INR 1.33 07/17/2013   INR 1.22 06/16/2013   ABG:  INR: Will add last result for INR, ABG once components are confirmed Will add last 4 CBG results once components are confirmed  Assessment/Plan:  1. CV - Previous accelerated junctional. Appears in SR this am. 2.  Pulmonary - Encourage incentive spirometer 3. Mild volume Overload - On lasix 40 daily 4.  Acute blood loss anemia - Last H and H  stable at 10.2 and 29.2 5.DM- 131/157/123. Pre op HGA1C 11.7. On Metformin 1000 bid and Insulin. Patient apprehensive about giving himself Insulin. HGA1C drawn again to determine level of control with Metformin. Follow up with a primary medical doctor has been arranged.  6.Will discuss discharge disposition with Dr. Cornelius Moraswen.   ZIMMERMAN,DONIELLE MPA-C 07/26/2013,7:46 AM  I have seen and examined the patient and agree with the assessment as outlined.  Hemoglobin a1c was 11.7 prior to surgery on 3/23.  Repeat ordered yesterday by Dr Vanessa BarbaraZamora was 10.1.  His pre-admission medical regimen obviously wasn't adequate.  I spoke with the patient and his family this morning.  He states that although he was initially apprehensive about taking insulin, he understands the importance and feels comfortable with once/daily injections.  Will d/c home today on metformin 1000 bid and once/day levemir or lantus insulin (whichever is cheaper) if he can get a glucometer and get his prescriptions filled.  OWEN,CLARENCE H 07/26/2013 9:23 AM

## 2013-07-27 ENCOUNTER — Encounter (HOSPITAL_COMMUNITY): Payer: Self-pay | Admitting: Thoracic Surgery (Cardiothoracic Vascular Surgery)

## 2013-08-08 ENCOUNTER — Ambulatory Visit (INDEPENDENT_AMBULATORY_CARE_PROVIDER_SITE_OTHER): Payer: Self-pay | Admitting: Adult Health

## 2013-08-08 ENCOUNTER — Encounter: Payer: Self-pay | Admitting: Adult Health

## 2013-08-08 VITALS — BP 105/47 | HR 79 | Ht 65.0 in | Wt 172.0 lb

## 2013-08-08 DIAGNOSIS — I50812 Chronic right heart failure: Secondary | ICD-10-CM

## 2013-08-08 DIAGNOSIS — Z9889 Other specified postprocedural states: Secondary | ICD-10-CM

## 2013-08-08 DIAGNOSIS — Z8774 Personal history of (corrected) congenital malformations of heart and circulatory system: Secondary | ICD-10-CM

## 2013-08-08 DIAGNOSIS — IMO0002 Reserved for concepts with insufficient information to code with codable children: Secondary | ICD-10-CM

## 2013-08-08 DIAGNOSIS — E1165 Type 2 diabetes mellitus with hyperglycemia: Secondary | ICD-10-CM

## 2013-08-08 DIAGNOSIS — IMO0001 Reserved for inherently not codable concepts without codable children: Secondary | ICD-10-CM

## 2013-08-08 DIAGNOSIS — I509 Heart failure, unspecified: Secondary | ICD-10-CM

## 2013-08-08 NOTE — Assessment & Plan Note (Signed)
He is to follow with his primary care physician concerning diabetic medications and control of blood glucose.

## 2013-08-08 NOTE — Progress Notes (Signed)
HPI: Mr.Loma-Emiliano is a 38 year old patient of Dr. Excell Seltzerooper and Dr. Diona BrownerMcDowell were following for ongoing assessment and management of atrial septal defect. The patient is originally from [place] and interpreter is used during this office assessment. The patient is status post ASD repair pericardial patch. However he was re\re seen by Dr. Excell Seltzerooper after a TEE was done to confirm a large secondum ASD and the site of previous surgical repair..The patient was symptomatic with the having worsening shortness of breath. He was seen by Dr. Excell Seltzerooper in  February of 2015 with plan transcatheter closure.  He was taken to the OR on 07/20/2013 undergoing redo patch closure of atrial septal defect and tricuspid valve repair.   Postoperative bradycardia, he was temporarily paced, and taken off beta blocker. He was not restarted on this during discharge.  He comes today with the assistance of a language interpreter. He is without cardiac complaints, but complains of multiple musculoskeletal issues to include neck pain lower back pain and shoulder pain. He is beginning to walk about 10 minutes every day and he has not had any issues with shortness of breath or palpitations.   No Known Allergies  Current Outpatient Prescriptions  Medication Sig Dispense Refill  . aspirin EC 81 MG EC tablet Take 1 tablet (81 mg total) by mouth daily.  30 tablet  12  . insulin NPH-regular Human (NOVOLIN 70/30) (70-30) 100 UNIT/ML injection Inject 25 Units into the skin 2 (two) times daily with a meal. Inject 25 units sq in the morning with breakfast and 10 units in the evening with supper  10 mL  11  . metFORMIN (GLUCOPHAGE) 1000 MG tablet Take 1 tablet (1,000 mg total) by mouth 2 (two) times daily with a meal.  60 tablet  8  . oxyCODONE (OXY IR/ROXICODONE) 5 MG immediate release tablet Take 1-2 tablets (5-10 mg total) by mouth every 4 (four) hours as needed for moderate pain.  30 tablet  0   No current facility-administered  medications for this visit.    Past Medical History  Diagnosis Date  . Type 2 diabetes mellitus   . Atrial septal defect   . Atrial septal defect, recurrent 06/16/2013  . Tricuspid regurgitation   . Diabetes type 2, uncontrolled 03/28/2013  . Atypical chest pain 03/27/2013  . Status post thoracotomy 12/14/1997    Left anterior thorac[facility] for lung biopsy [facility]   . Chronic right-sided congestive heart failure   . Right ventricular dysfunction   . S/P atrial septal defect closure 11/22/1997    Pericardial patch closure of reported secundum type ASD via median sternotomy by Dr Buford DresserPennington [facility]   . Partial anomalous pulmonary venous return   . Shortness of breath   . S/P redo atrial septal defect closure 07/20/2013    Redo patch closure of persistent/recurrent atrial septal defect   . S/P tricuspid valve repair 07/20/2013    28 mm Edwards mc3 ring annuloplasty    Past Surgical History  Procedure Laterality Date  . Asd repair  11/22/1997    [facility]ton [facility]  . Back surgery  2011  . Appendectomy  2012  . Tee without cardioversion N/A 05/26/2013    Procedure: TRANSESOPHAGEAL ECHOCARDIOGRAM (TEE);  Surgeon: Antoine PocheJonathan F Branch, MD;  Location: [facility];  Service: Cardiology;  Laterality: N/A;  . Thoracotomy Left 12/14/1997    Lung biopsy @ [facility]  . Asd repair N/A 07/20/2013    Procedure: REDO CLOSURE  OF ATRIAL SEPTAL DEFECT (ASD);  Surgeon: Purcell Nailslarence H Owen, MD;  [facility];  Service: Open Heart Surgery;  Laterality: N/A;  . Tricuspid valve replacement N/A 07/20/2013    Procedure: TRICUSPID VALVE REPAIR;  Surgeon: Purcell Nailslarence H Owen, MD;  Location: [facility] OR;  Service: Open Heart Surgery;  Laterality: N/A;  . Intraoperative transesophageal echocardiogram N/A 07/20/2013    Procedure: INTRAOPERATIVE TRANSESOPHAGEAL ECHOCARDIOGRAM;  Surgeon: Purcell Nailslarence H Owen, MD;  [facility];  Service: Open Heart Surgery;  Laterality: N/A;    ROS:  Review of systems complete  and found to be negative unless listed above  PHYSICAL EXAM BP 105/47  Pulse 79  Ht 5\' 5"  (1.651 m)  Wt 172 lb (78.019 kg)  BMI 28.62 kg/m2 General: Well developed, well nourished, in no acute distress Head: Eyes PERRLA, No xanthomas.   Normal cephalic and atramatic  Lungs: Clear bilaterally to auscultation and percussion. Heart: HRRR S1 S2, with soft systolic murmur.  Pulses are 2+ & equal.            No carotid bruit. No JVD.  No abdominal bruits. No femoral bruits. Abdomen: Bowel sounds are positive, abdomen soft and non-tender without masses or                  Hernia's noted. Msk:  Back normal, normal gait. Normal strength and tone for age. Extremities: No clubbing, cyanosis or edema.  DP +1 Neuro: Alert and oriented X 3. Psych:  Good affect, responds appropriately  ASSESSMENT AND PLAN

## 2013-08-08 NOTE — Assessment & Plan Note (Signed)
He is doing well and without cardiac complaint. I would like him to start cardiac rehabilitation, but his Medicaid processing has not been completed 10 he is unable to afford it on his own accord. He will continue his current medication regimen as all over Dr. Wyline MoodBranch in approximately 3 months. His been advised to increase his activity slowly, but if he has any worsening symptoms of recurrent discomfort shortness of breath dizziness or chest pain he is present to the emergency room or call us.

## 2013-08-08 NOTE — Progress Notes (Deleted)
Name: [name]    DOB: January 16, 1976  Age: 38 y.o.  MR#: [id number]       PCP:  No PCP Per Patient      Insurance: Payor: MEDICAID PENDING / Plan: MEDICAID PENDING / Product Type: *No Product type* /   CC:    Chief Complaint  Patient presents with  . Atrial Septal Defect  . Shortness of Breath    VS Filed Vitals:   08/08/13 1412  BP: 105/47  Pulse: 79  Height: 5\' 5"  (1.651 m)  Weight: 172 lb (78.019 kg)    Weights Current Weight  08/08/13 172 lb (78.019 kg)  07/26/13 179 lb 14.3 oz (81.6 kg)  07/26/13 179 lb 14.3 oz (81.6 kg)    Blood Pressure  BP Readings from Last 3 Encounters:  08/08/13 105/47  07/26/13 116/70  07/26/13 116/70     Admit date:  (Not on file) Last encounter with RMR:  Visit date not found   Allergy Review of patient's allergies indicates no known allergies.  Current Outpatient Prescriptions  Medication Sig Dispense Refill  . aspirin EC 81 MG EC tablet Take 1 tablet (81 mg total) by mouth daily.  30 tablet  12  . insulin NPH-regular Human (NOVOLIN 70/30) (70-30) 100 UNIT/ML injection Inject 25 Units into the skin 2 (two) times daily with a meal. Inject 25 units sq in the morning with breakfast and 10 units in the evening with supper  10 mL  11  . metFORMIN (GLUCOPHAGE) 1000 MG tablet Take 1 tablet (1,000 mg total) by mouth 2 (two) times daily with a meal.  60 tablet  8  . oxyCODONE (OXY IR/ROXICODONE) 5 MG immediate release tablet Take 1-2 tablets (5-10 mg total) by mouth every 4 (four) hours as needed for moderate pain.  30 tablet  0   No current facility-administered medications for this visit.    Discontinued Meds:    Medications Discontinued During This Encounter  Medication Reason  . furosemide (LASIX) 40 MG tablet Error  . potassium chloride SA (K-DUR,KLOR-CON) 20 MEQ tablet Error    Patient Active Problem List   Diagnosis Date Noted  . S/P redo atrial septal defect closure + tricuspid valve repair 07/20/2013  . S/P tricuspid  valve repair 07/20/2013  . Chronic right-sided congestive heart failure   . Right ventricular dysfunction   . Partial anomalous pulmonary venous return   . Tricuspid regurgitation 05/26/2013  . Hyperglycemia 03/28/2013  . Diabetes type 2, uncontrolled 03/28/2013  . Status post thoracotomy   . Atypical chest pain 03/27/2013  . S/P patch closure of atrial septal defect 11/22/1997    LABS    Component Value Date/Time   NA 135* 07/24/2013 0415   NA 132* 07/23/2013 0355   NA 135* 07/22/2013 0410   K 4.2 07/24/2013 0415   K 3.6* 07/23/2013 0355   K 3.7 07/22/2013 0410   CL 95* 07/24/2013 0415   CL 94* 07/23/2013 0355   CL 96 07/22/2013 0410   CO2 26 07/24/2013 0415   CO2 28 07/23/2013 0355   CO2 28 07/22/2013 0410   GLUCOSE 191* 07/24/2013 0415   GLUCOSE 150* 07/23/2013 0355   GLUCOSE 167* 07/22/2013 0410   BUN 10 07/24/2013 0415   BUN 10 07/23/2013 0355   BUN 9 07/22/2013 0410   CREATININE 0.59 07/24/2013 0415   CREATININE 0.65 07/23/2013 0355   CREATININE 0.62 07/22/2013 0410   CREATININE 0.70 07/07/2013 1216   CREATININE 0.60 06/16/2013 1748   CALCIUM 8.1*  07/24/2013 0415   CALCIUM 8.2* 07/23/2013 0355   CALCIUM 8.2* 07/22/2013 0410   GFRNONAA >90 07/24/2013 0415   GFRNONAA >90 07/23/2013 0355   GFRNONAA >90 07/22/2013 0410   GFRAA >90 07/24/2013 0415   GFRAA >90 07/23/2013 0355   GFRAA >90 07/22/2013 0410   CMP     Component Value Date/Time   NA 135* 07/24/2013 0415   K 4.2 07/24/2013 0415   CL 95* 07/24/2013 0415   CO2 26 07/24/2013 0415   GLUCOSE 191* 07/24/2013 0415   BUN 10 07/24/2013 0415   CREATININE 0.59 07/24/2013 0415   CREATININE 0.70 07/07/2013 1216   CALCIUM 8.1* 07/24/2013 0415   PROT 6.7 07/17/2013 1538   ALBUMIN 3.3* 07/17/2013 1538   AST 39* 07/17/2013 1538   ALT 44 07/17/2013 1538   ALKPHOS 140* 07/17/2013 1538   BILITOT 0.4 07/17/2013 1538   GFRNONAA >90 07/24/2013 0415   GFRAA >90 07/24/2013 0415       Component Value Date/Time   WBC 8.0 07/24/2013 0415   WBC 9.7 07/23/2013  0355   WBC 12.2* 07/22/2013 0410   HGB 10.2* 07/24/2013 0415   HGB 10.5* 07/23/2013 0355   HGB 12.0* 07/22/2013 0410   HCT 29.2* 07/24/2013 0415   HCT 30.3* 07/23/2013 0355   HCT 34.3* 07/22/2013 0410   MCV 94.8 07/24/2013 0415   MCV 94.4 07/23/2013 0355   MCV 93.5 07/22/2013 0410    Lipid Panel     Component Value Date/Time   CHOL 154 03/28/2013 0919   TRIG 140 03/28/2013 0919   HDL 33* 03/28/2013 0919   CHOLHDL 4.7 03/28/2013 0919   VLDL 28 03/28/2013 0919   LDLCALC 93 03/28/2013 0919    ABG    Component Value Date/Time   PHART 7.340* 07/21/2013 0006   PCO2ART 41.9 07/21/2013 0006   PO2ART 84.0 07/21/2013 0006   HCO3 22.6 07/21/2013 0006   TCO2 26 07/21/2013 1707   ACIDBASEDEF 3.0* 07/21/2013 0006   O2SAT 96.0 07/21/2013 0006     Lab Results  Component Value Date   TSH 2.323 03/28/2013   BNP (last 3 results)  Recent Labs  03/27/13 2154  PROBNP 33.8   Cardiac Panel (last 3 results) No results found for this basename: CKTOTAL, CKMB, TROPONINI, RELINDX,  in the last 72 hours  Iron/TIBC/Ferritin No results found for this basename: iron, tibc, ferritin     EKG Orders placed during the hospital encounter of 07/20/13  . EKG 12-LEAD  . EKG 12-LEAD  . EKG     Prior Assessment and Plan Problem List as of 08/08/2013     Cardiovascular and Mediastinum   Tricuspid regurgitation   Chronic right-sided congestive heart failure   Right ventricular dysfunction   Partial anomalous pulmonary venous return     Endocrine   Diabetes type 2, uncontrolled     Other   Atypical chest pain   Hyperglycemia   Status post thoracotomy   S/P redo atrial septal defect closure + tricuspid valve repair   S/P tricuspid valve repair   S/P patch closure of atrial septal defect       Imaging: Dg Chest 2 View  07/24/2013   CLINICAL DATA:  Status post ASD closure and tricuspid valve repair.  EXAM: CHEST - 2 VIEW  COMPARISON:  DG CHEST 1V PORT dated 07/23/2013; DG CHEST 1V PORT dated 07/22/2013   FINDINGS: Stable positioning of central line. There is slight worsening of bilateral perihilar and lower lung atelectasis. No pneumothorax, pulmonary  edema or pleural effusions are identified. The heart size and mediastinal contours are stable.  IMPRESSION: No active disease.   Electronically Signed   By: Irish Lack M.D.   On: 07/24/2013 08:03   Dg Chest 2 View  07/17/2013   CLINICAL DATA:  Atrial septal defect.  EXAM: CHEST  2 VIEW  COMPARISON:  March 27, 2013.  FINDINGS: Stable cardiomediastinal silhouette. Sternotomy wires are noted. No pneumothorax or pleural effusion is noted. Stable scarring is seen in right suprahilar region. Stable scarring and postoperative change seen in lingular region. No acute pulmonary disease is noted. Bony thorax is intact.  IMPRESSION: No acute cardiopulmonary abnormality seen.   Electronically Signed   By: Roque Lias M.D.   On: 07/17/2013 16:33   Dg Chest Port 1 View  07/23/2013   CLINICAL DATA:  Chest pain.  ASD repair.  EXAM: PORTABLE CHEST - 1 VIEW  COMPARISON:  DG CHEST 1V PORT dated 07/22/2013; CT HEART MORPH/PULM VEIN W/CM&W/O CA SCORE dated 06/30/2013; DG CHEST 2 VIEW dated 03/27/2013; CT CTA ABD/PEL W/CM AND/OR W/O CM dated 07/13/2013  FINDINGS: Worsening lung aeration with streaky basilar opacities bilaterally. There is cardiomegaly and pulmonary venous congestion. Unchanged positioning of a left subclavian central line. No evidence of effusion or pneumothorax.  IMPRESSION: 1. Postoperative atelectasis has mildly increased. 2. Pulmonary venous congestion.   Electronically Signed   By: Tiburcio Pea M.D.   On: 07/23/2013 06:35   Dg Chest Port 1 View  07/22/2013   CLINICAL DATA:  Status post closure of atrial septal defect and tricuspid valve repair.  EXAM: PORTABLE CHEST - 1 VIEW  COMPARISON:  DG CHEST 1V PORT dated 07/21/2013; DG CHEST 2 VIEW dated 07/17/2013; DG CHEST 1V PORT dated 07/20/2013; DG CHEST 1V PORT dated 07/20/2013; CT CTA ABD/PEL W/CM AND/OR W/O CM  dated 07/13/2013  FINDINGS: The Swan-Ganz catheter and right jugular sheath has been removed. Left subclavian central line shows stable positioning with the tip at the brachiocephalic venous confluence. There is no evidence of pneumothorax. Lungs show persistent chronic disease, low volumes and bilateral lower lobe atelectasis. Cardiac and mediastinal contours are stable. Resolution of pulmonary edema.  IMPRESSION: No pneumothorax. No further evidence of pulmonary edema. Low volumes with bilateral lower lobe atelectasis.   Electronically Signed   By: Irish Lack M.D.   On: 07/22/2013 08:52   Dg Chest Portable 1 View In Am  07/21/2013   CLINICAL DATA:  Postop repair of ASD.  EXAM: PORTABLE CHEST - 1 VIEW  COMPARISON:  07/20/2013  FINDINGS: Swan-Ganz catheter tip is in the main pulmonary artery. Left subclavian central line tip is in the upper SVC. Interval extubation and removal of NG tube.  Previously seen small right pneumothorax no longer visualized. Increasing bilateral airspace disease, likely edema. Mild cardiomegaly.  IMPRESSION: Worsening bilateral airspace disease, likely pulmonary edema.  No visible pneumothorax.   Electronically Signed   By: Charlett Nose M.D.   On: 07/21/2013 08:11   Dg Chest Port 1 View  07/20/2013   CLINICAL DATA:  Follow-up pneumothorax  EXAM: PORTABLE CHEST - 1 VIEW  COMPARISON:  07/20/2013  FINDINGS: Cardiomegaly again noted. Stable right Swan-Ganz catheter position. Stable endotracheal and NG tube position. Left IJ central line is unchanged in position. Mediastinal drain again noted. Small right upper lateral pneumothorax again noted without significant change. Stable central vascular congestion and mild perihilar interstitial prominence. No convincing pulmonary edema. Stable left basilar atelectasis or infiltrate.  IMPRESSION: Stable right Swan-Ganz catheter position.  Stable endotracheal and NG tube position. Left IJ central line is unchanged in position. Mediastinal drain  again noted. Small right upper lateral pneumothorax again noted without significant change. Stable central vascular congestion and mild perihilar interstitial prominence. No convincing pulmonary edema. Stable left basilar atelectasis or infiltrate.   Electronically Signed   By: Natasha MeadLiviu  Pop M.D.   On: 07/20/2013 16:30   Dg Chest Portable 1 View  07/20/2013   CLINICAL DATA:  Status post cardiac surgery  EXAM: PORTABLE CHEST - 1 VIEW  COMPARISON:  07/17/2013  FINDINGS: Cardiac shadow is mildly enlarged. Postoperative changes are again seen. A Swan-Ganz catheter is noted with the tip in the pulmonary outflow tract. A left central venous line is noted in the proximal superior vena cava. A left jugular sheath is seen. The endotracheal tube is noted 3.9 cm above the carina. A nasogastric catheter is seen coiled within the stomach. Mediastinal drains are noted.  The lungs are well aerated bilaterally. A small right-sided pneumothorax is seen. The overall inspiratory effort is poor with crowding of the vascular markings.  IMPRESSION: Postsurgical change.  Tubes and lines as described.  Small right pneumothorax is noted. Continued followup is recommended.  Poor inspiratory effort with crowding of the vascular markings. No confluent infiltrate is seen.  These results were called by telephone at the time of interpretation on 07/20/2013 at 3:31 PM to Marcina MillardKristin Matherly the patient's nurse, who verbally acknowledged these results.   Electronically Signed   By: Alcide CleverMark  Lukens M.D.   On: 07/20/2013 15:35   Ct Angio Abd/pel W/ And/or W/o  07/13/2013   CLINICAL DATA:  Preoperative evaluation prior to repair of atrial septal defect.  EXAM: CT ANGIOGRAPHY ABDOMEN AND PELVIS WITH CONTRAST AND WITHOUT CONTRAST  TECHNIQUE: Multidetector CT imaging of the abdomen and pelvis was performed using the standard protocol during bolus administration of intravenous contrast. Multiplanar reconstructed images and MIPs were obtained and reviewed to  evaluate the vascular anatomy.  CONTRAST:  100mL OMNIPAQUE IOHEXOL 350 MG/ML SOLN  COMPARISON:  CT HEART MORPH/PULM VEIN W/CM&W/O CA SCORE dated 06/30/2013  FINDINGS: The abdominal aorta is well opacified. The aorta shows normal caliber and no evidence of aneurysmal disease or significant atherosclerosis. The celiac axis, superior mesenteric artery and inferior mesenteric artery are normally patent. There are 2 separate widely patent arteries supplying the right kidney and a single widely patent left renal artery.  Common, external and internal iliac arteries show normal patency bilaterally. The common femoral arteries and femoral bifurcations are normally patent.  There is prominent reflux of injected contrast into the intrahepatic IVC and dilated hepatic veins throughout the liver. Findings are consistent with right heart failure. The rest of the abdomen and pelvis shows no significant nonvascular findings. There likely is mild steatosis of the liver. Some calcified granulomata are present in the spleen. The kidneys appear normal bilaterally. There is no evidence of focal mass lesion or enlarged lymph nodes. The bladder is unremarkable. No hernias are seen. No bony abnormalities are identified.  Review of the MIP images confirms the above findings.  IMPRESSION: 1. Widely patent abdominal aorta, visceral branch vessels and iliac arteries.  2. Prominent reflux of contrast into the intrahepatic IVC and dilated hepatic veins consistent with right heart failure.   Electronically Signed   By: Irish LackGlenn  Yamagata M.D.   On: 07/13/2013 14:23

## 2013-08-08 NOTE — Patient Instructions (Signed)
Your physician recommends that you schedule a follow-up appointment in: 3 months with Dr Koneswaran You will receive a reminder letter two months in advance reminding you to call and schedule your appointment. If you don't receive this letter, please contact our office.  Your physician recommends that you continue on your current medications as directed. Please refer to the Current Medication list given to you today.   

## 2013-08-08 NOTE — Assessment & Plan Note (Signed)
He has not had any right heart failure, fluid retention, or any symptoms that are concerning. He is not on an anticoagulate or diuretic at this time. Pressure is low normal. He is not on a beta blocker. She was unable to tolerate this secondary to significant bradycardia and hypotension. Will be followed in 3 months.

## 2013-08-09 ENCOUNTER — Other Ambulatory Visit: Payer: Self-pay | Admitting: Thoracic Surgery (Cardiothoracic Vascular Surgery)

## 2013-08-09 DIAGNOSIS — I509 Heart failure, unspecified: Secondary | ICD-10-CM

## 2013-08-14 ENCOUNTER — Ambulatory Visit (INDEPENDENT_AMBULATORY_CARE_PROVIDER_SITE_OTHER): Payer: Self-pay | Admitting: Thoracic Surgery (Cardiothoracic Vascular Surgery)

## 2013-08-14 ENCOUNTER — Ambulatory Visit
Admission: RE | Admit: 2013-08-14 | Discharge: 2013-08-14 | Disposition: A | Discharge status: Home / Self Care | Payer: Self-pay | Source: Ambulatory Visit | Attending: Thoracic Surgery (Cardiothoracic Vascular Surgery) | Admitting: Thoracic Surgery (Cardiothoracic Vascular Surgery)

## 2013-08-14 ENCOUNTER — Encounter: Payer: Self-pay | Admitting: Thoracic Surgery (Cardiothoracic Vascular Surgery)

## 2013-08-14 VITALS — BP 106/67 | HR 65 | Resp 16 | Ht 65.0 in | Wt 172.5 lb

## 2013-08-14 DIAGNOSIS — Z9889 Other specified postprocedural states: Secondary | ICD-10-CM

## 2013-08-14 DIAGNOSIS — Z8774 Personal history of (corrected) congenital malformations of heart and circulatory system: Secondary | ICD-10-CM

## 2013-08-14 DIAGNOSIS — I079 Rheumatic tricuspid valve disease, unspecified: Secondary | ICD-10-CM

## 2013-08-14 DIAGNOSIS — I071 Rheumatic tricuspid insufficiency: Secondary | ICD-10-CM

## 2013-08-14 DIAGNOSIS — I509 Heart failure, unspecified: Secondary | ICD-10-CM

## 2013-08-14 MED ORDER — OXYCODONE HCL 5 MG PO TABS: 5.0000 mg | ORAL_TABLET | ORAL | Status: DC | PRN

## 2013-08-14 NOTE — Progress Notes (Signed)
[address]       [place] [id number]             [phone]     CARDIOTHORJacky KindleCIC SURGERY OFFICE NOTE  Referring Provider is Tonny Bollmanooper, Michael, MD PCP is No PCP Per Patient   HPI:  Patient returns for routine followup status post redo patch closure of persistent or recurrent atrial septal defect with tricuspid valve repair on 07/20/2013. The patient also had partial anomalous pulmonary venous return with drainage of a small pulmonary vein from the right upper lobe directly into the superior vena cava. This was not repaired at the time of surgery because of the relatively small size of the vein and the relatively long distance between its insertion in the vena cava and the right atrium. Intraoperatively oxygen saturations were measured after separation from bypass and notable for insignificant residual shunt.  The patient's postoperative recovery has been uncomplicated although his early postoperative course in the hospital was notable for bradycardia which resolved. Because of this he was not discharged on a beta blocker. The patient also has underlying poorly controlled type 2 diabetes mellitus. He was discharged on insulin therapy and has been scheduled for followup in outpatient medical clinic in [place].  He was seen in followup at the [facility] office last week and he returns to our office for routine followup today. He is accompanied by his wife and an interpreter. The patient reports doing very well. He still has some soreness in his chest, and he has been having some pains in his upper chest and neck particularly at night when he is trying to sleep.  His appetite is good. He has no shortness of breath. Overall he is pleased with his progress. He states that his blood sugars have been under reasonable control, typically measured approximately 140 every morning.   Current Outpatient Prescriptions  Medication Sig Dispense Refill  . aspirin EC 81 MG EC tablet  Take 1 tablet (81 mg total) by mouth daily.  30 tablet  12  . insulin NPH-regular Human (NOVOLIN 70/30) (70-30) 100 UNIT/ML injection Inject 25 Units into the skin 2 (two) times daily with a meal. Inject 25 units sq in the morning with breakfast and 10 units in the evening with supper  10 mL  11  . metFORMIN (GLUCOPHAGE) 1000 MG tablet Take 1 tablet (1,000 mg total) by mouth 2 (two) times daily with a meal.  60 tablet  8  . oxyCODONE (OXY IR/ROXICODONE) 5 MG immediate release tablet Take 1-2 tablets (5-10 mg total) by mouth every 4 (four) hours as needed for moderate pain.  30 tablet  0   No current facility-administered medications for this visit.      Physical Exam:   BP 106/67  Pulse 65  Resp 16  Ht 5\' 5"  (1.651 m)  Wt 172 lb 8 oz (78.245 kg)  BMI 28.71 kg/m2  SpO2 98%  General:  Well-appearing  Chest:   Clear to auscultation  CV:   Regular rate and rhythm without murmur  Incisions:  Clean and dry and healing nicely, sternum is stable  Abdomen:  Soft and nontender  Extremities:  Warm and well-perfused  Diagnostic Tests:  CHEST 2 VIEW  COMPARISON: 07/24/2013  FINDINGS:  Sequelae of tricuspid valve repair are again seen. Right atrial  contour is prominent. Left subclavian central venous catheter has  been removed. There is improved aeration of the lungs with interval  decrease in perihilar and bibasilar  lung opacities seen on the prior  study. There is no evidence of new airspace consolidation, pleural  effusion, or pneumothorax. Postsurgical changes are noted laterally  in the right chest wall.  IMPRESSION:  Improved lung aeration with decreased bilateral lung opacities.  Electronically Signed  By: Sebastian AcheAllen Grady  On: 08/14/2013 11:24     Impression:  Patient is doing very well just over 3 weeks status post redo patch closure of persistent or recurrent atrial septal defect with tricuspid valve repair.  Plan:  I've encouraged patient to continue to increase his  physical activity as tolerated with his primary limitation is remaining that he refrain from heavy lifting or strenuous use of his arms or shoulders for at least another 2 months. I have renewed his prescription for pain medicine. Once he gets to the point where he no longer needs pain relievers during the daytime he may resume driving an automobile.  He has been counseled regarding the need to keep his diabetes under strict control. All of his questions been addressed. We will have him return in 3 months to make sure he continues to recover uneventfully.   Salvatore Decentlarence H. Cornelius Moraswen, MD 08/14/2013 11:59 AM

## 2013-08-14 NOTE — Patient Instructions (Addendum)
The patient should continue to avoid any heavy lifting or strenuous use of arms or shoulders for at least a total of three months from the time of surgery.  The patient may return to driving an automobile as long as they are no longer requiring oral narcotic pain relievers during the daytime.  It would be wise to start driving only short distances during the daylight and gradually increase from there as they feel comfortable.  The patient is reminded to make every effort to keep their diabetes under very tight control.  They should follow up closely with their primary care physician or endocrinologist and strive to keep their hemoglobin A1c levels as low as possible, preferably near or below 6.0  

## 2013-08-31 ENCOUNTER — Encounter: Payer: Self-pay | Admitting: Physician Assistant

## 2013-08-31 NOTE — Progress Notes (Signed)
Patient ID: Terry HardyGerardo Craig, male   DOB: 27-Jun-1975, 38 y.o.   MRN: [id number]   Update to discharge summary from hospitalization 07/21/13    The patient has been discharged on:   1.Beta Blocker:  Yes [   ]                              No   [x   ]                              If No, reason: Due to bradycardia with expected sinus node dysfunction  2.Ace Inhibitor/ARB: Yes [   ]                                     No  [x   ]                                     If No, reason: Labile Blood pressure  3.Statin:   Yes [   ]                  No  [ x  ]                  If No, reason: No CAD  4.Marlowe KaysEcasa:  Yes  [ x  ]                  No   [   ]                  If No, reason:

## 2013-09-21 ENCOUNTER — Encounter (HOSPITAL_COMMUNITY): Payer: MEDICAID

## 2013-09-27 ENCOUNTER — Ambulatory Visit (HOSPITAL_COMMUNITY): Payer: MEDICAID

## 2013-11-13 ENCOUNTER — Ambulatory Visit (INDEPENDENT_AMBULATORY_CARE_PROVIDER_SITE_OTHER): Payer: Self-pay | Admitting: Thoracic Surgery (Cardiothoracic Vascular Surgery)

## 2013-11-13 ENCOUNTER — Encounter: Payer: Self-pay | Admitting: Thoracic Surgery (Cardiothoracic Vascular Surgery)

## 2013-11-13 VITALS — BP 110/75 | HR 86 | Resp 16 | Ht 65.0 in | Wt 169.5 lb

## 2013-11-13 DIAGNOSIS — Z9889 Other specified postprocedural states: Secondary | ICD-10-CM

## 2013-11-13 DIAGNOSIS — Z8774 Personal history of (corrected) congenital malformations of heart and circulatory system: Secondary | ICD-10-CM

## 2013-11-13 DIAGNOSIS — Q211 Atrial septal defect, unspecified: Secondary | ICD-10-CM

## 2013-11-13 DIAGNOSIS — I079 Rheumatic tricuspid valve disease, unspecified: Secondary | ICD-10-CM

## 2013-11-13 DIAGNOSIS — I071 Rheumatic tricuspid insufficiency: Secondary | ICD-10-CM

## 2013-11-13 DIAGNOSIS — Q2111 Secundum atrial septal defect: Secondary | ICD-10-CM

## 2013-11-13 NOTE — Progress Notes (Signed)
      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  He was last seen here in our office on 08/14/2013. Since then he has continued to do very well. He has not yet been seen in followup by Dr. Excell Seltzerooper, and he has not had a followup echocardiogram since surgery. He returns to the office today with his wife and with an interpreter present for his visit. He still has some soreness in his chest related to his redo, particularly at the apex of the incision overlying the manubrium. He denies any sensation of clicking or motion of the sternum. The discomfort in the chest is exacerbated by turning his head from side to side or stretching as though he is reaching up to grab something from high on a shelf.  He denies any shortness of breath and in fact he states that his breathing is considerably better than it was prior to surgery. His appetite is good.  Otherwise he has no complaints.    Current Outpatient Prescriptions  Medication Sig Dispense Refill  . aspirin EC 81 MG EC tablet Take 1 tablet (81 mg total) by mouth daily.  30 tablet  12  . insulin NPH-regular Human (NOVOLIN 70/30) (70-30) 100 UNIT/ML injection Inject 25 Units into the skin 2 (two) times daily with a meal. Inject 25 units sq in the morning with breakfast and 10 units in the evening with supper  10 mL  11  . metFORMIN (GLUCOPHAGE) 1000 MG tablet Take 1 tablet (1,000 mg total) by mouth 2 (two) times daily with a meal.  60 tablet  8   No current facility-administered medications for this visit.      Physical Exam:   BP 110/75  Pulse 86  Resp 16  Ht 5\' 5"  (1.651 m)  Wt 169 lb 8 oz (76.885 kg)  BMI 28.21 kg/m2  SpO2  97%  General:  Well-appearing  Chest:   Clear to auscultation  CV:   Regular rate and rhythm without murmur  Incisions:  Completely healed, sternum is stable  Abdomen:  Soft and nontender  Extremities:  Warm and well-perfused  Diagnostic Tests:  n/a   Impression:  Patient is doing well nearly 4 months status post redo patch closure of persistent or recurrent atrial septal defect and tricuspid valve repair.   Plan:  I've encouraged patient to continue to gradually increase his physical activity without any particular limitations at this time. I've reminded him to schedule followup appointment with Dr. Excell Seltzerooper at some point within the next 3-6 months. I've also reminded the patient and his wife how important it will remain for him to continue to manage his diabetes carefully. The patient will return for routine followup next March up proximally one year after his original operation.  I spent in excess of 10 minutes during the conduct of this office consultation and >50% of this time involved direct face-to-face encounter with the patient for counseling and/or coordination of their care.  Salvatore Decentlarence H. Cornelius Moraswen, MD 11/13/2013 2:04 PM

## 2013-11-13 NOTE — Patient Instructions (Signed)
Patient may resume normal physical activity without any particular limitations at this time, and return to our office only as needed should any further problems or questions arise.  

## 2014-02-01 ENCOUNTER — Ambulatory Visit: Payer: Self-pay

## 2014-04-05 ENCOUNTER — Encounter (HOSPITAL_COMMUNITY): Payer: Self-pay | Admitting: Cardiovascular Disease

## 2014-07-16 ENCOUNTER — Ambulatory Visit: Payer: Self-pay | Admitting: Thoracic Surgery (Cardiothoracic Vascular Surgery)

## 2014-08-06 ENCOUNTER — Ambulatory Visit: Payer: Self-pay | Admitting: Thoracic Surgery (Cardiothoracic Vascular Surgery)

## 2014-08-07 ENCOUNTER — Telehealth: Payer: Self-pay | Admitting: *Deleted

## 2014-09-03 ENCOUNTER — Ambulatory Visit (INDEPENDENT_AMBULATORY_CARE_PROVIDER_SITE_OTHER): Payer: Self-pay | Admitting: Thoracic Surgery (Cardiothoracic Vascular Surgery)

## 2014-09-03 ENCOUNTER — Encounter: Payer: Self-pay | Admitting: Thoracic Surgery (Cardiothoracic Vascular Surgery)

## 2014-09-03 VITALS — BP 113/71 | HR 79 | Resp 16 | Ht 65.0 in | Wt 178.0 lb

## 2014-09-03 DIAGNOSIS — Z9889 Other specified postprocedural states: Secondary | ICD-10-CM

## 2014-09-03 DIAGNOSIS — Z8774 Personal history of (corrected) congenital malformations of heart and circulatory system: Secondary | ICD-10-CM

## 2014-09-03 NOTE — Patient Instructions (Signed)

## 2014-09-03 NOTE — Progress Notes (Signed)
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 He was last seen here in our office on 11/13/2013. Since then the patient has done very well. He is back at work although he states that he remains careful to avoid any heavy lifting. He reports normal exercise tolerance with no symptoms of shortness of breath whatsoever. He denies any history of palpitations or exertional chest discomfort. He has occasional twinges of pain in the left chest when he sleeps on his left side. He also complains of some irritation associated with the sternal wire at the upper end of his sternotomy incision. Overall he feels well and has no complaints. He has not been seen in follow-up by Dr. Excell Seltzerooper since surgery and he has not had a follow-up echocardiogram performed. He reports that he has a appointment to see Dr. Excell Seltzerooper in June.   Current Outpatient Prescriptions  Medication Sig Dispense Refill  . aspirin EC 81 MG EC tablet Take 1 tablet (81 mg total) by mouth daily. 30 tablet 12  . metFORMIN (GLUCOPHAGE) 1000 MG tablet Take 1 tablet (1,000 mg total) by mouth 2 (two) times daily with a meal. 60 tablet 8   No current facility-administered medications for this visit.      Physical Exam:   BP 113/71 mmHg  Pulse 79  Resp 16  Ht 5\' 5"  (1.651 m)  Wt 178 lb (80.74 kg)  BMI 29.62 kg/m2  SpO2 98%  General:  Well-appearing  Chest:   clear  CV:   Regular rate and rhythm without murmur  Incisions:  Completely healed, sternum is stable. Tenderness on palpation of the sternal wire at the top of the manubrium.  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 This was not corrected at the time of surgery because the anomalous vein was too high to facilitate baffling through the atrial septum. As a result, the patient may have a small residual left to right shunt. Clinically the patient is doing very well.  Plan:  I have reminded the patient that he needs to keep his appointment with Dr. Excell Seltzerooper. I think it might be reasonable to check a routine follow-up echocardiogram. Because the patient is clinically doing very well I doubt that any further testing needs to be done at this time. I have reminded the patient regarding the lifelong need for antibiotic prophylaxis for all dental cleaning and related procedures. All of his questions again addressed.  I spent in excess of 10 minutes during the conduct of this office consultation and >50% of this time involved direct face-to-face encounter with the patient for counseling and/or coordination of their care.  Salvatore Decentlarence H. Cornelius Moraswen, MD 09/03/2014 2:30 PM

## 2014-12-03 IMAGING — CT CT CTA ABD/PEL W/CM AND/OR W/O CM
2 of 8 series · 14 of 46 positions shown, 18 images · IV contrast (APPLIED)
Comparison: CT HEART MORPH/PULM VEIN W/CM&W/O CA SCORE dated
06/30/2013

CLINICAL DATA: Preoperative evaluation prior to repair of atrial
septal defect.

EXAM:
CT ANGIOGRAPHY ABDOMEN AND PELVIS WITH CONTRAST AND WITHOUT CONTRAST
TECHNIQUE: Multidetector CT imaging of the abdomen and pelvis was performed
using the standard protocol during bolus administration of
intravenous contrast. Multiplanar reconstructed images and MIPs were
obtained and reviewed to evaluate the vascular anatomy.
CONTRAST:  100mL OMNIPAQUE IOHEXOL 350 MG/ML SOLN

[Series 4: dissection 3.0 i30f 3 · axial · 0.77mm/px · z∈[-479,-62]mm · 11 of 162 slices shown, 15 images]
[im 15/162  soft-tissue]
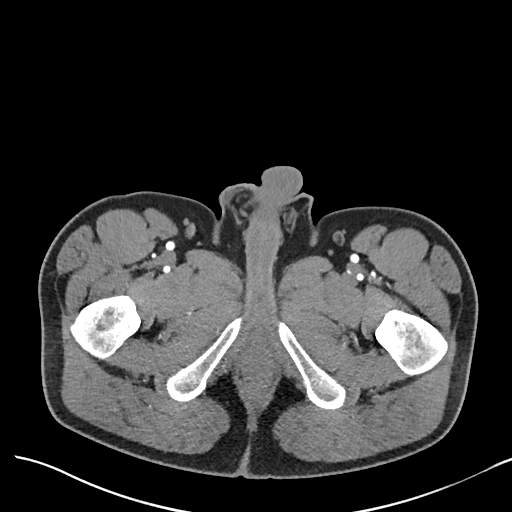
[im 15/162  bone]
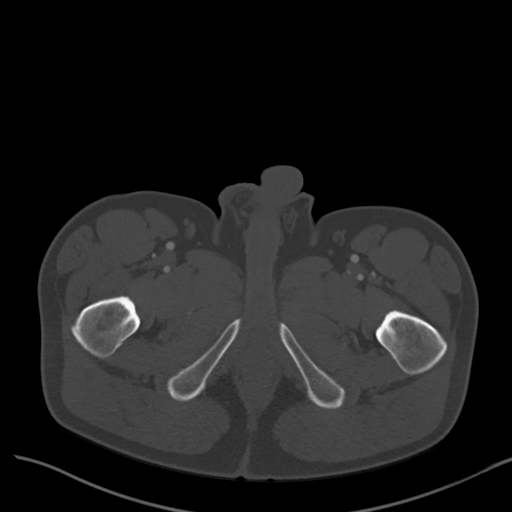
[im 30/162  soft-tissue]
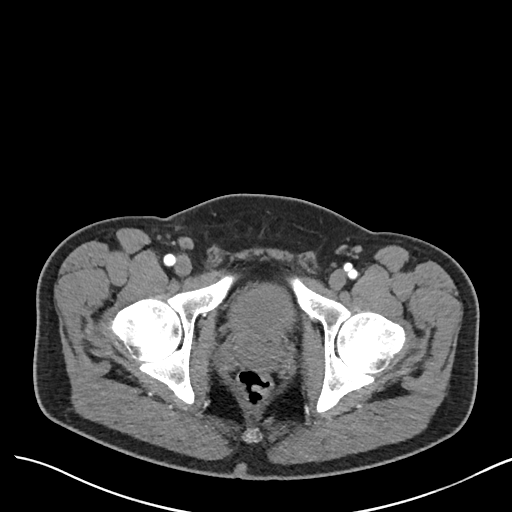
[im 44/162  soft-tissue]
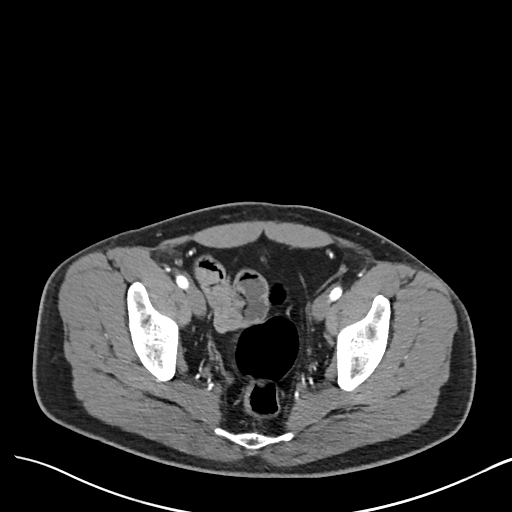
[im 66/162  soft-tissue]
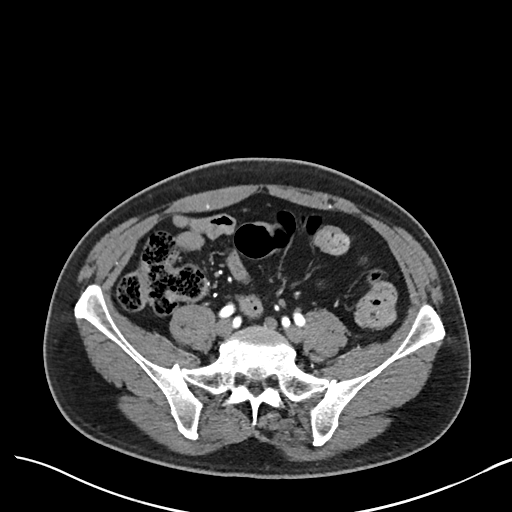
[im 81/162  soft-tissue]
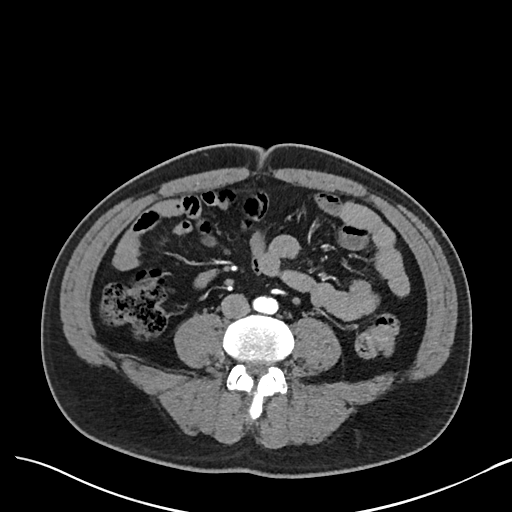
[im 96/162  soft-tissue]
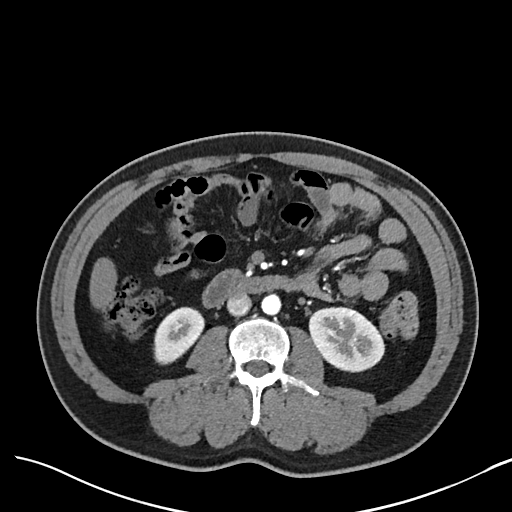
[im 118/162  soft-tissue]
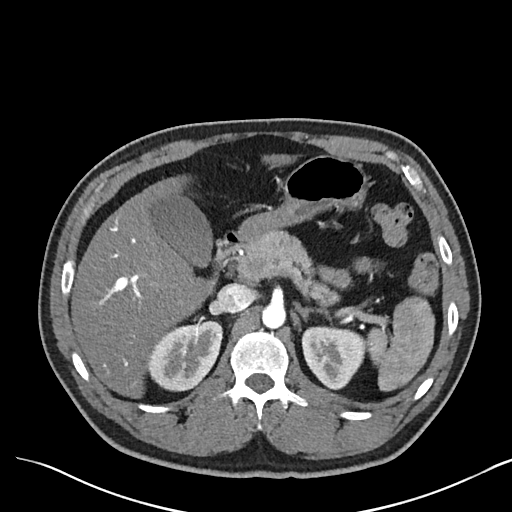
[im 132/162  soft-tissue]
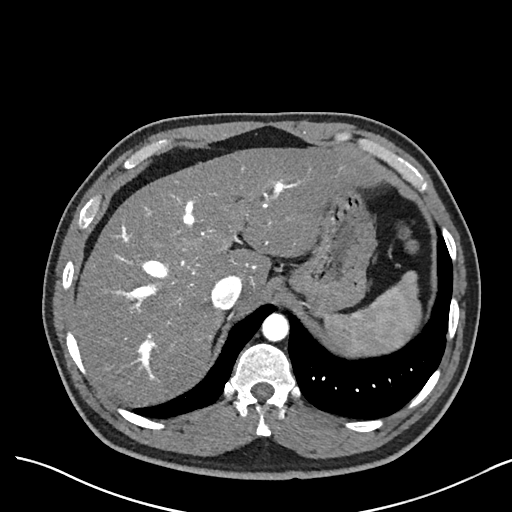
[im 132/162  lung]
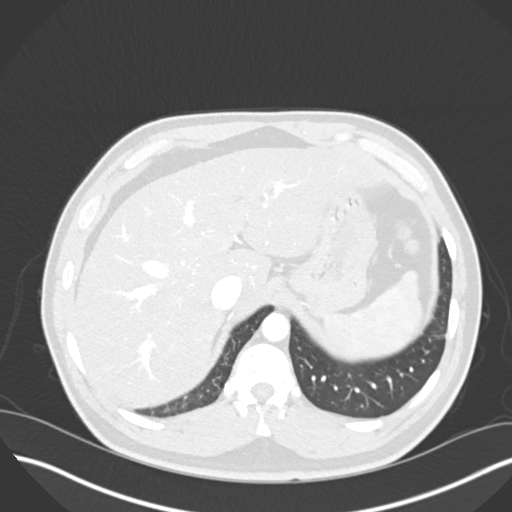
[im 140/162  lung]
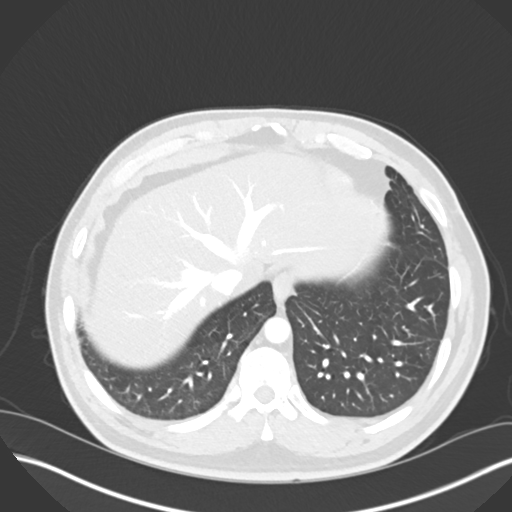
[im 147/162  soft-tissue]
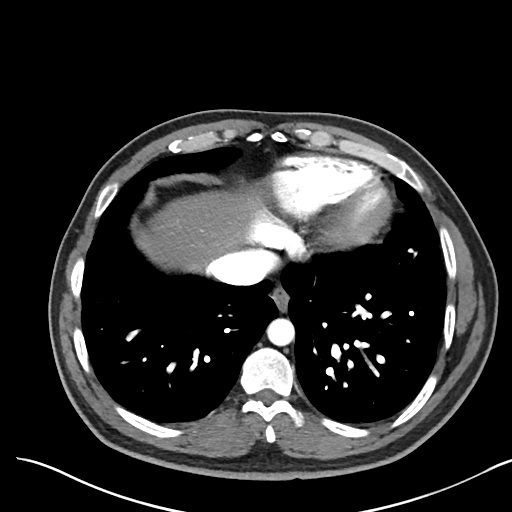
[im 147/162  lung]
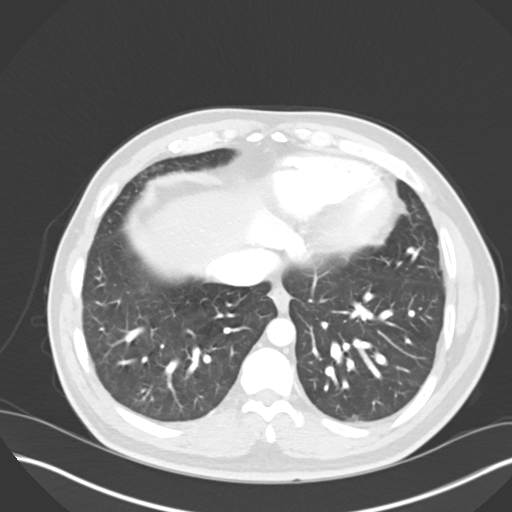
[im 147/162  bone]
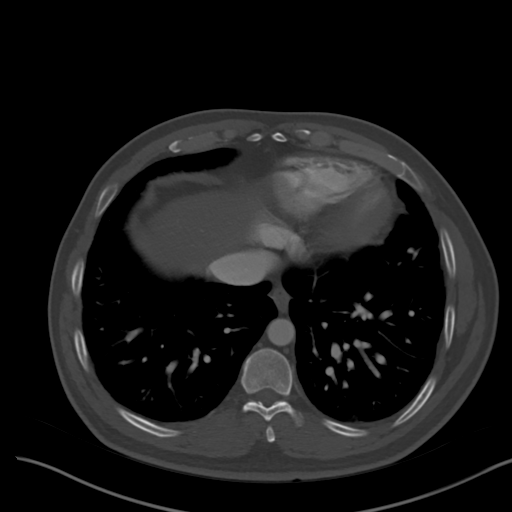
[im 154/162  lung]
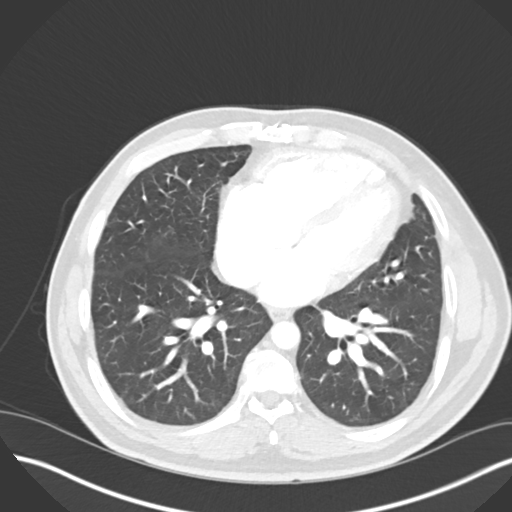

[Series 7: coronals · coronal · 0.76mm/px · 3 of 126 slices shown]
[im 32/126  soft-tissue]
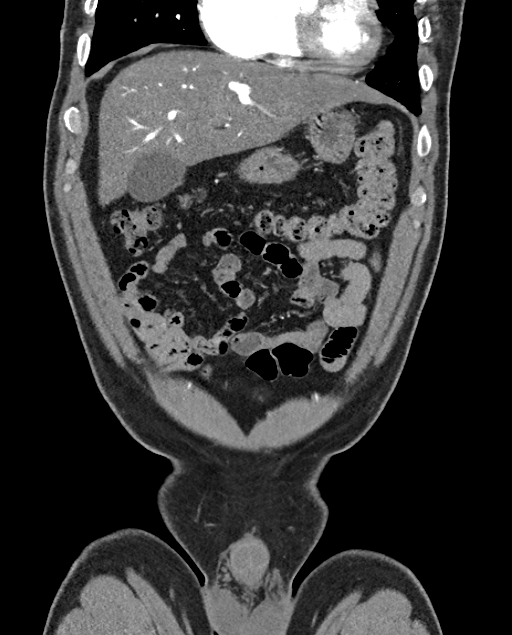
[im 63/126  soft-tissue]
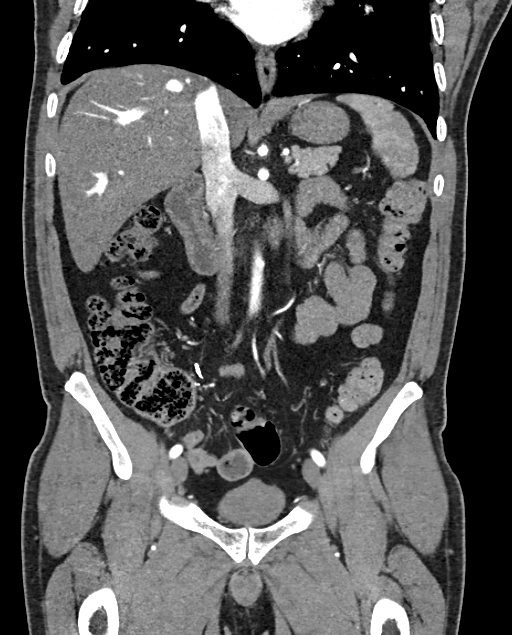
[im 94/126  soft-tissue]
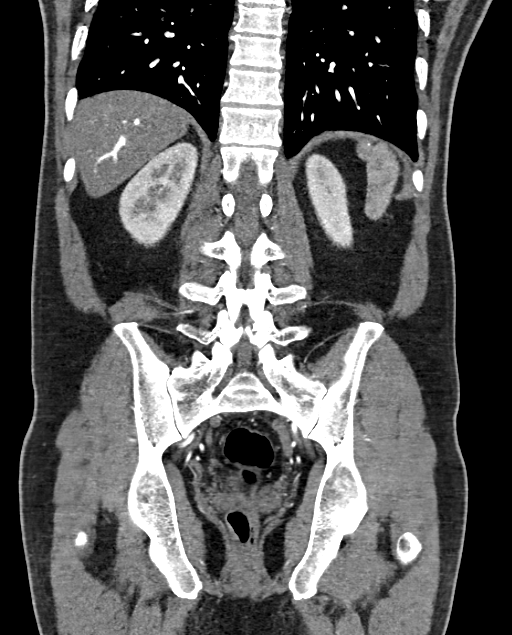

[14 of 46 positions shown; findings below may reference images not displayed]

FINDINGS: The abdominal aorta is well opacified. The aorta shows normal
caliber and no evidence of aneurysmal disease or significant
atherosclerosis. The celiac axis, superior mesenteric artery and
inferior mesenteric artery are normally patent. There are 2 separate
widely patent arteries supplying the right kidney and a single
widely patent left renal artery.

Common, external and internal iliac arteries show normal patency
bilaterally. The common femoral arteries and femoral bifurcations
are normally patent.

There is prominent reflux of injected contrast into the intrahepatic
IVC and dilated hepatic veins throughout the liver. Findings are
consistent with right heart failure. The rest of the abdomen and
pelvis shows no significant nonvascular findings. There likely is
mild steatosis of the liver. Some calcified granulomata are present
in the spleen. The kidneys appear normal bilaterally. There is no
evidence of focal mass lesion or enlarged lymph nodes. The bladder
is unremarkable. No hernias are seen. No bony abnormalities are
identified.

Review of the MIP images confirms the above findings.
IMPRESSION: 1. Widely patent abdominal aorta, visceral branch vessels and iliac
arteries.

2. Prominent reflux of contrast into the intrahepatic IVC and
dilated hepatic veins consistent with right heart failure.

## 2014-12-07 IMAGING — CR DG CHEST 2V
2 series · 2 of 2 positions shown · non-contrast
Comparison: March 27, 2013.

CLINICAL DATA: Atrial septal defect.

EXAM:
CHEST  2 VIEW

[w chest pa]
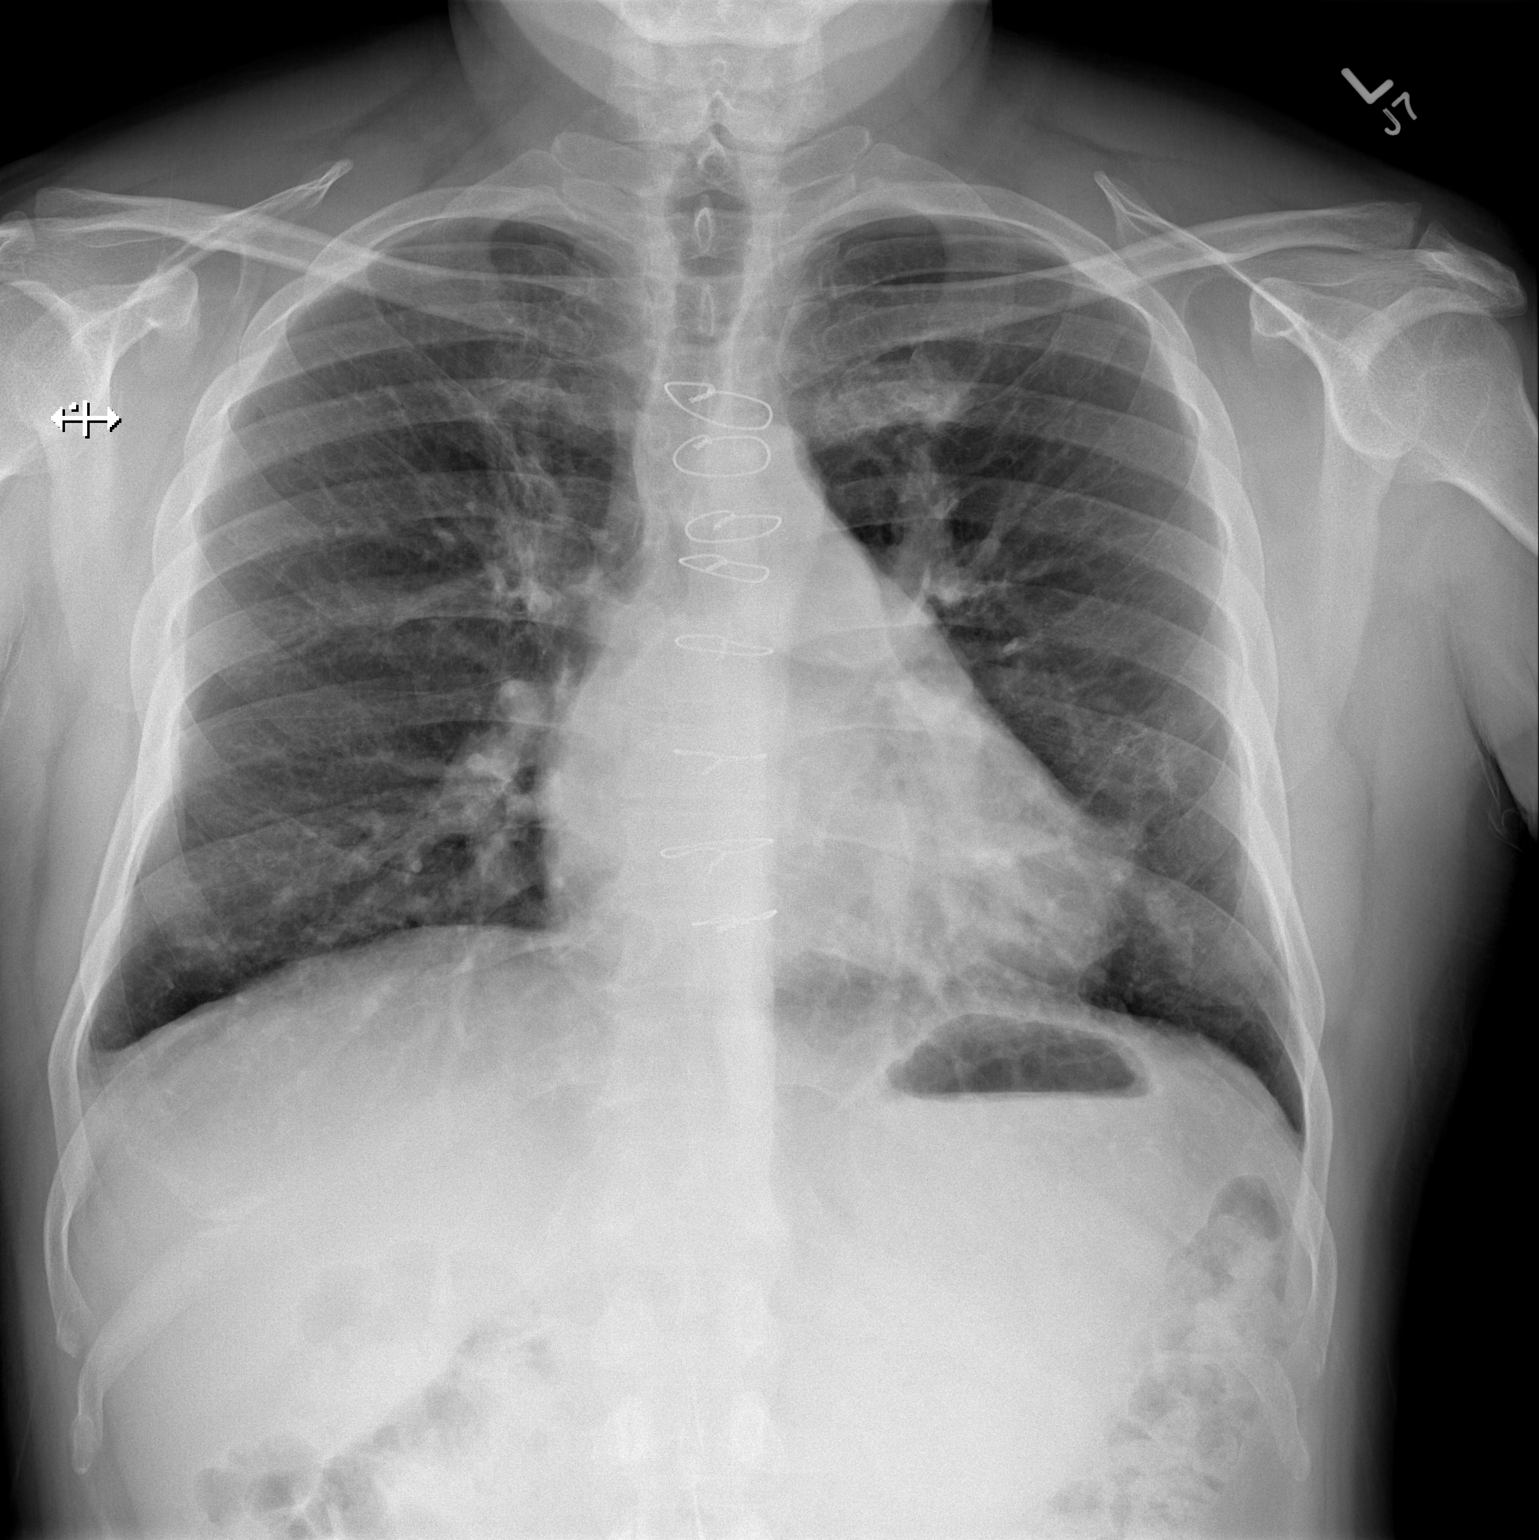

[w chest lat]
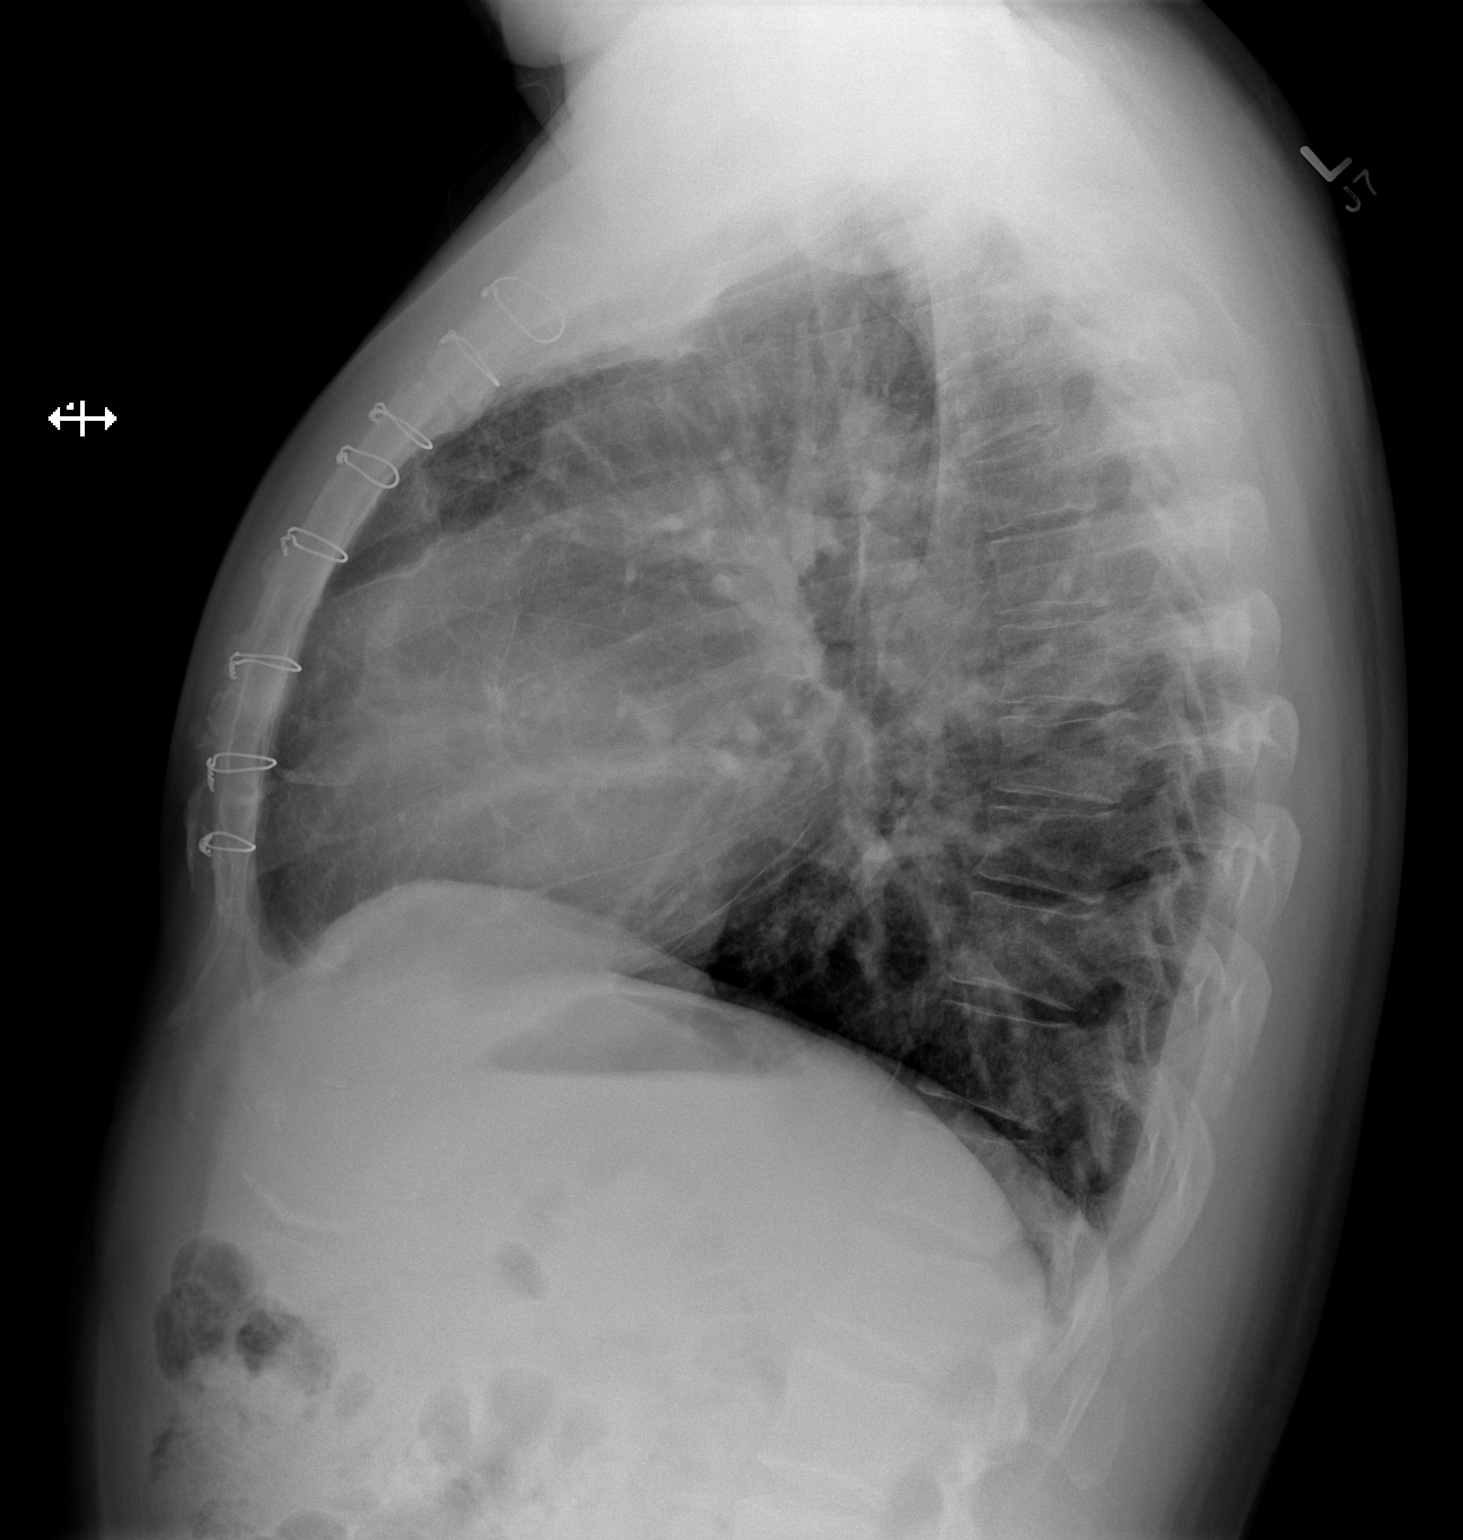

[2 of 2 positions shown; findings below may reference images not displayed]

FINDINGS: Stable cardiomediastinal silhouette. Sternotomy wires are noted. No
pneumothorax or pleural effusion is noted. Stable scarring is seen
in right suprahilar region. Stable scarring and postoperative change
seen in lingular region. No acute pulmonary disease is noted. Bony
thorax is intact.
IMPRESSION: No acute cardiopulmonary abnormality seen.

## 2015-05-21 ENCOUNTER — Emergency Department (HOSPITAL_COMMUNITY): Payer: Self-pay

## 2015-05-21 ENCOUNTER — Emergency Department (HOSPITAL_COMMUNITY)
Admission: EM | Admit: 2015-05-21 | Discharge: 2015-05-21 | Disposition: A | Discharge status: Home / Self Care | Payer: Self-pay | Attending: Emergency Medicine | Admitting: Emergency Medicine

## 2015-05-21 ENCOUNTER — Encounter (HOSPITAL_COMMUNITY): Payer: Self-pay | Admitting: Emergency Medicine

## 2015-05-21 DIAGNOSIS — N481 Balanitis: Secondary | ICD-10-CM | POA: Insufficient documentation

## 2015-05-21 DIAGNOSIS — Z9889 Other specified postprocedural states: Secondary | ICD-10-CM | POA: Insufficient documentation

## 2015-05-21 DIAGNOSIS — Z7982 Long term (current) use of aspirin: Secondary | ICD-10-CM | POA: Insufficient documentation

## 2015-05-21 DIAGNOSIS — Z7984 Long term (current) use of oral hypoglycemic drugs: Secondary | ICD-10-CM | POA: Insufficient documentation

## 2015-05-21 DIAGNOSIS — E1165 Type 2 diabetes mellitus with hyperglycemia: Secondary | ICD-10-CM | POA: Insufficient documentation

## 2015-05-21 DIAGNOSIS — R739 Hyperglycemia, unspecified: Secondary | ICD-10-CM

## 2015-05-21 DIAGNOSIS — Z79899 Other long term (current) drug therapy: Secondary | ICD-10-CM | POA: Insufficient documentation

## 2015-05-21 DIAGNOSIS — I509 Heart failure, unspecified: Secondary | ICD-10-CM | POA: Insufficient documentation

## 2015-05-21 DIAGNOSIS — R0789 Other chest pain: Secondary | ICD-10-CM | POA: Insufficient documentation

## 2015-05-21 DIAGNOSIS — Z8774 Personal history of (corrected) congenital malformations of heart and circulatory system: Secondary | ICD-10-CM | POA: Insufficient documentation

## 2015-05-21 LAB — URINALYSIS, ROUTINE W REFLEX MICROSCOPIC
BILIRUBIN URINE: NEGATIVE
Glucose, UA: >1000 mg/dL — AB
HGB URINE DIPSTICK: NEGATIVE
Ketones, ur: NEGATIVE mg/dL
Leukocytes, UA: NEGATIVE
NITRITE: NEGATIVE
PH: 6 (ref 5.0–8.0)
Protein, ur: NEGATIVE mg/dL
SPECIFIC GRAVITY, URINE: 1.01 (ref 1.005–1.030)

## 2015-05-21 LAB — URINE MICROSCOPIC-ADD ON: RBC / HPF: NONE SEEN RBC/hpf (ref 0–5)

## 2015-05-21 LAB — BASIC METABOLIC PANEL
Anion gap: 13 (ref 5–15)
BUN: 14 mg/dL (ref 6–20)
CO2: 22 mmol/L (ref 22–32)
CREATININE: 0.9 mg/dL (ref 0.61–1.24)
Calcium: 8.6 mg/dL — ABNORMAL LOW (ref 8.9–10.3)
Chloride: 97 mmol/L — ABNORMAL LOW (ref 101–111)
GFR calc Af Amer: >60 mL/min (ref >60)
GLUCOSE: 519 mg/dL — AB (ref 65–99)
Potassium: 4.4 mmol/L (ref 3.5–5.1)
SODIUM: 132 mmol/L — AB (ref 135–145)

## 2015-05-21 LAB — CBG MONITORING, ED: GLUCOSE-CAPILLARY: 378 mg/dL — AB (ref 65–99)

## 2015-05-21 LAB — CBC
HEMATOCRIT: 44.7 % (ref 39.0–52.0)
Hemoglobin: 16.1 g/dL (ref 13.0–17.0)
MCH: 33.5 pg (ref 26.0–34.0)
MCHC: 36 g/dL (ref 30.0–36.0)
MCV: 92.9 fL (ref 78.0–100.0)
PLATELETS: 169 10*3/uL (ref 150–400)
RBC: 4.81 MIL/uL (ref 4.22–5.81)
RDW: 12 % (ref 11.5–15.5)
WBC: 7.1 10*3/uL (ref 4.0–10.5)

## 2015-05-21 LAB — TROPONIN I: Troponin I: <0.03 ng/mL (ref <0.031)

## 2015-05-21 MED ORDER — INSULIN ASPART 100 UNIT/ML ~~LOC~~ SOLN
12.0000 [IU] | Freq: Once | SUBCUTANEOUS | Status: AC
  Administered 2015-05-21: 12 [IU] via SUBCUTANEOUS
  Filled 2015-05-21: qty 1

## 2015-05-21 MED ORDER — CLOTRIMAZOLE 1 % EX CREA: TOPICAL_CREAM | CUTANEOUS | Status: AC

## 2015-05-21 MED ORDER — SODIUM CHLORIDE 0.9 % IV BOLUS (SEPSIS)
1000.0000 mL | Freq: Once | INTRAVENOUS | Status: AC
  Administered 2015-05-21: 1000 mL via INTRAVENOUS

## 2015-05-21 MED ORDER — CLOTRIMAZOLE 1 % EX CREA
TOPICAL_CREAM | CUTANEOUS | Status: AC
  Filled 2015-05-21: qty 15

## 2015-05-21 MED ORDER — GLIPIZIDE 5 MG PO TABS: 5.0000 mg | ORAL_TABLET | Freq: Every day | ORAL | Status: AC

## 2015-05-21 MED ORDER — CLOTRIMAZOLE 1 % EX CREA
TOPICAL_CREAM | Freq: Once | CUTANEOUS | Status: AC
  Administered 2015-05-21: 21:00:00 via TOPICAL
  Filled 2015-05-21: qty 15

## 2015-05-21 NOTE — ED Notes (Signed)
Patient complaining of left sided chest pain x 2 days. Also complaining of itching and burning to penis.

## 2015-05-21 NOTE — Discharge Instructions (Signed)
It was our pleasure to provide your ER care today - we hope that you feel better.  Your blood sugar is very high today (519).   Drink plenty of water. Follow diabetic diet.  Take diabetic medication as prescribed.  Take the glipizide that we prescribed, in addition to your metformin.   Follow up with primary care doctor in the next 1-2 days for recheck - call office tomorrow morning to arrange appointment.  Keep your foreskin and head of penis area very clean/dry.  Wash with warm water and soap 2x/day, then dry area completely.  Apply a thin coat of clotrimazole cream to area 2-3x/day.  Return to ER if worse, new symptoms, fevers, vomiting, persistent/recurrent chest pain, trouble breathing, severe pain/spreading redness to penis, other concern.   Diabetes mellitus tipo2, adultos (Type 2 Diabetes Mellitus, Adult) La diabetes mellitus tipo2, generalmente denominada diabetes tipo2, es una enfermedad prolongada (crnica). En la diabetes tipo2, el pncreas no produce suficiente insulina (una hormona), las clulas son menos sensibles a la insulina que se produce (resistencia a la insulina), o ambos. Normalmente, la [employer] azcares de los alimentos a las clulas de los tejidos. Las clulas de los tejidos [employer] azcares para Psychiatrist. La falta de insulina o la falta de una respuesta normal a la insulina hace que el exceso de azcar se acumule en la sangre en lugar de Customer service manager en las clulas de los tejidos. Como resultado, se producen niveles altos de Banker (hiperglucemia). El [address] de los niveles altos de azcar (glucosa) puede causar muchas complicaciones.  La diabetes tipo2 antes tambin se denominaba diabetes del [place], pero puede ocurrir a Actuary.  FACTORES DE RIESGO  Una persona tiene mayor predisposicin a desarrollar diabetes tipo 2 si alguien en su familia tiene la enfermedad y tambin tiene uno o ms de los siguientes factores de riesgo  principales:  Aumento de [place], sobrepeso u obesidad.  Estilo de vida sedentario.  Una historia de consumo constante de alimentos de altas caloras. Mantener un peso saludable y realizar actividad fsica regular puede reducir la probabilidad de desarrollar diabetes tipo 2. SNTOMAS  Una persona con diabetes tipo 2 no presenta sntomas en un principio. Los sntomas de la diabetes tipo 2 aparecen lentamente. Los sntomas son:  Aumento de la sed (polidipsia).  Aumento de la miccin (poliuria).  Orina con ms frecuencia durante la noche (nocturia).  Cambios repentinos en el peso o sin motivo aparente.  Infecciones frecuentes y recurrentes.  Cansancio (fatiga).  Debilidad.  Cambios en la visin, como visin borrosa.  Olor a Water quality scientist.  Dolor abdominal.  Nuseas o vmitos.  Cortes o moretones que tardan en sanarse.  Hormigueo o adormecimiento de las manos y los pies.  Una herida abierta en la piel (lcera). DIAGNSTICO Con frecuencia la diabetes tipo 2 no se diagnostica hasta que se presentan las complicaciones de la diabetes. La diabetes tipo 2 se diagnostica cuando los sntomas o las complicaciones se presentan y cuando aumentan los niveles de glucosa en la [place]. El nivel de glucosa en la sangre puede controlarse en uno o ms de los siguientes anlisis de sangre:  Medicin de glucosa en la sangre en [place]. No se le permitir comer durante al menos 8 horas antes de que se tome [place] de [place].  Pruebas al azar de glucosa en la sangre. El nivel de glucosa en la sangre se controla en cualquier momento del da sin importar el momento en que  haya comido.  Prueba de A1c (hemoglobina glucosilada) Una prueba de A1c proporciona informacin sobre el control de la glucosa en la sangre durante los ltimos 3 meses.  Prueba de tolerancia a la glucosa oral (PTGO). La glucosa en la sangre se mide despus de no haber comido (ayunas) durante dos horas y despus de beber una  bebida que contenga glucosa. TRATAMIENTO   Usted puede necesitar administrarse insulina o medicamentos para la diabetes todos los das para [employer] niveles de glucosa en la sangre en el rango deseado.  Si [place] insulina, tal vez necesite ajustar la dosis segn los carbohidratos que haya consumido en cada comida o colacin.  [employer] del tratamiento, se recomienda hacer cambios en el estilo de vida. Estos pueden incluir lo siguiente:  Customer service manager de alimentacin personalizado elaborado por un nutricionista.  Hacer ejercicio fsico a diario. El mdico establecer los objetivos personalizados del tratamiento para usted en funcin de su edad, los medicamentos que toma, el tiempo que hace que tiene diabetes y cualquier otra enfermedad que padezca. Generalmente, el objetivo del tratamiento es [employer] siguientes niveles sanguneos de glucosa:  Antes de las comidas (preprandial): de 80 a 130 mg/dl.  Antes de las comidas (preprandial): de 80 a 130 mg/dl.  A1c: menos del 6,5 % al 7 %. INSTRUCCIONES PARA EL CUIDADO EN EL HOGAR   Controle su nivel de hemoglobina A1c dos veces al ao.  Contrlese a diario Air cabin crew de glucosa en la sangre segn las indicaciones de su mdico.  Supervise las cetonas en la orina cuando est enferma y segn las indicaciones de su mdico.  Tome el medicamento para la diabetes o adminstrese insulina segn las indicaciones de su mdico para Radio producer nivel de glucosa en la sangre en el rango deseado.  Nunca se quede sin medicamento para la diabetes o sin insulina. Es necesario que la reciba [employer].  Si [place] insulina, tal vez deba ajustar la cantidad de insulina administrada segn los carbohidratos consumidos. Los hidratos de carbono pueden aumentar los niveles de glucosa en la sangre, pero deben incluirse en su dieta. Los hidratos de carbono aportan vitaminas, minerales y [place] que son [place] parte esencial de una dieta saludable. Los hidratos de  carbono se encuentran en frutas, verduras, cereales integrales, productos lcteos, legumbres y alimentos que contienen azcares aadidos.  Consuma alimentos saludables. Programe una cita con un nutricionista certificado para que lo ayude a Chief Executive Officer de alimentacin adecuado para usted.  Baje de peso si es necesario.  Lleve una tarjeta de alerta mdica o use una pulsera o medalla de alerta mdica.  Lleve con usted una colacin de 15gramos de hidratos de carbono en todo momento para controlar los niveles bajos de glucosa en la sangre (hipoglucemia). Algunos ejemplos de colaciones de 15gramos de hidratos de carbono son los siguientes:  Tabletas de glucosa, 3 o 4.  Gel de glucosa, tubo de 15 gramos.  Pasas de uva, 2 cucharadas (24 gramos).  Caramelos de goma, 6.  Galletas de [place], 8.  Gaseosa comn, 4onzas ( ).  Pastillas de goma, 9.  Reconocer la hipoglucemia. La hipoglucemia se produce cuando los niveles de glucosa en la sangre son de 70 mg/dl o menos. El riesgo de hipoglucemia aumenta durante el ayuno o cuando se saltea las comidas, durante o despus de Education officer, environmental ejercicio intenso y [place] duerme. Los sntomas de hipoglucemia son:  Temblores o sacudidas.  Disminucin de la capacidad de concentracin.  Sudoracin.  Aumento de la frecuencia  cardaca.  Dolor de [place].  Sequedad en la boca.  Hambre.  Irritabilidad.  Ansiedad.  Sueo agitado.  Alteracin del habla o de la coordinacin.  Confusin.  Tratar la hipoglucemia rpidamente. Si usted est alerta y puede tragar con seguridad, siga la regla de 15/15 que consiste en:  [employer] 15 y 20gramos de glucosa de accin rpida o carbohidratos. Las opciones de accin rpida son un gel de glucosa, tabletas de glucosa, o 4 onzas (120 ml) de jugo de frutas, gaseosa comn, o leche baja en grasa.  Compruebe su nivel de glucosa en la sangre 15 minutos despus de tomar la glucosa.  Tome entre  15 y 20 gramos ms de glucosa si el nivel de glucosa en la sangre todava es de 70mg /dl o inferior.  Ingiera una comida o una colacin en el lapso de 1 hora una vez que los niveles de glucosa en la sangre vuelven a la normalidad.  Est atento a si siente mucha sed u orina con mayor frecuencia, porque son signos tempranos de hiperglucemia. El reconocimiento temprano de la hiperglucemia permite un tratamiento oportuno. Trate la hiperglucemia segn le indic su mdico.  Haga, al menos, de actividad fsica moderada a la semana, distribuidos en, por lo menos, 3das a la semana o como lo indique su mdico. Adems, debe realizar ejercicios de resistencia por lo menos 2veces a la semana o como lo indique su mdico. Trate de no permanecer inactivo durante ms de seguidos.  Ajuste su medicamento y la ingesta de alimentos, segn sea necesario, si inicia un nuevo ejercicio o deporte.  Siga su plan para los [address] de enfermedad cuando no puede comer o beber como de [place].  No consuma ningn producto que contenga tabaco, como cigarrillos, tabaco de Theatre manager o Administrator, Civil Service. Si necesita ayuda para dejar de fumar, hable con el mdico.  Limite el consumo de alcohol a no ms de 1 medida por da en las mujeres no embarazadas y 2 medidas en los hombres. Debe beber alcohol solo mientras come. Hable con su mdico acerca de si el alcohol es seguro para usted. Informe a su mdico si bebe alcohol varias veces a la semana.  Concurra a todas las visitas de control como se lo haya indicado el mdico. Esto es importante.  Programe un examen de la vista poco despus del diagnstico de diabetes tipo 2 y luego anualmente.  Realice diariamente el cuidado de la piel y de los pies. Examine su piel y los pies diariamente para ver si tiene cortes, moretones, enrojecimiento, problemas en las uas, sangrado, ampollas o Advertising account planner. Su mdico debe hacerle un examen de los pies una vez por  ao.  Cepllese los dientes y encas por lo menos dos veces al da y use hilo dental al menos una vez por da. Concurra regularmente a las visitas de control con el dentista.  Comparta su plan de control de diabetes en el trabajo o en la escuela.  Mantngase al da con las vacunas. Se recomienda que se vacune contra la gripe todos los [place]. Adems, que se vacune contra la neumona (vacuna antineumoccica). Si es mayor de 64 aos y nunca se Animator la neumona, esta vacuna puede administrarse como una serie de dos vacunas por separado. Pregntele al mdico qu otras vacunas se pueden recomendar.  Aprenda a Dealer.  Obtenga la mayor cantidad posible de informacin sobre la diabetes y solicite ayuda siempre que sea necesario.  Busque programas de rehabilitacin y participe en ellos  para mantener o mejorar su independencia y su calidad de vida. Solicite la derivacin a fisioterapia o terapia ocupacional si se le [employer] o la mano, o tiene problemas para asearse, vestirse, comer, o durante la [place] fsica. SOLICITE ATENCIN MDICA SI:   No puede comer alimentos o beber por ms de 6 horas.  Tuvo nuseas o ha vomitado durante ms de 6 horas.  Su nivel de glucosa en la sangre es mayor de 240 mg/dl.  Presenta algn cambio en el estado mental.  Desarrolla una enfermedad grave adicional.  Tuvo diarrea durante ms de 6 horas.  Ha estado enfermo o ha tenido fiebre durante un par [address] y no mejora.  Siente dolor al practicar cualquier actividad fsica. SOLICITE ATENCIN MDICA DE INMEDIATO SI:  Tiene dificultad para respirar.  Tiene niveles de cetonas moderados a altos.   Esta informacin no tiene Theme park manager el consejo del mdico. Asegrese de hacerle al mdico cualquier pregunta que tenga.   Document Released: 04/13/2005 Document Revised: 01/02/2015 Elsevier Interactive Patient Education 2016 Elsevier Inc.    Hiperglucemia (Hyperglycemia) La  hiperglucemia ocurre cuando la glucosa (azcar) en su sangre est demasiado elevada. Puede suceder por varias razones, pero a menudo ocurre en personas que no saben que tienen diabetes o no la controlan adecuadamente.  CAUSAS Tanto si tiene diabetes como si no, existen otras causas para la hiperglucemia. La hiperglucemia puede producirse cuando tiene diabetes, pero tambin puede presentarse en otras situaciones de las que podra no ser consciente, como por ejemplo: Diabetes  Si tiene diabetes y tiene problemas para controlar su glucosa en sangre, la hiperglucemia podra producirse debido a las siguientes razones:  No seguir Press photographer.  No tomar los medicamentos para la diabetes o tomarlos de forma inadecuada.  Realizar menos ejercicio del que normalmente hace.  Estar enfermo. Prediabetes  Esto no puede ignorarse. Antes de que la persona presente diabetes de tipo 2, casi siempre hay "prediabetes". Esto ocurre cuando su glucosa en sangre es mayor que lo normal, pero no lo suficiente como para diagnosticar diabetes. La investigacin ha demostrado que algunos daos al cuerpo de Air cabin crew, en especial los del corazn y el sistema circulatorio, podran haber ocurrido durante el periodo de prediabetes. Si controla la glucosa en sangre cuando tiene prediabetes, podr retardar o evitar que se desarrrolle la diabetes tipo 2. El estrs  Si tiene diabetes, deber hacer una dieta, tomar medicamentos orales o insulina para [place]. Sin embargo, Clinical research associate que la glucosa en sangre es mayor que lo normal en el hospital tenga o no diabetes. Cientficamente se lo denomina "hiperglucemia por estrs". El estrs puede elevar su glucosa en sangre. Esto ocurre porque el organismo genera hormonas en los momentos de estrs. Si el estrs ha [employer] causa del alto nivel de glucosa en [place] mdico podr Education officer, environmental un seguimiento de [place] regular. Feliberto Harts, podr asegurarse de que la  hiperglucemia no empeora o progresa hacia diabetes. Esteroides  Los esteroides son medicamentos que actan en la infeccin que ataca al sistema inmunolgico para bloquear la inflamacin o la infeccin. Un efecto secundario puede ser el aumento de glucosa en [place]. Muchas personas pueden producir la suficiente insulina extra para este aumento, pero aquellos que no pueden, los esteroides pueden [employer] niveles sean an [place]. No es inusual que los tratamientos con esteorides "destapen" una diabetes que se est desarrollando. No siempre es posible determinar si la hiperglucemia desaparecer una vez que se detenga el consumo  de esteroides. A veces se realiza un anlisis de sangre especial denominado A1c para determinar si la glucosa en sangre se ha elevado antes de comenzar con el consumo de esteroides. SNTOMAS  Sed.  Necesidad frecuente de Geographical information systems officer.  [employer].  Visin borrosa.  Cansancio o fatiga.  Debilidad.  Somnolencia.  Hormigueo en el pie o pierna. DIAGNSTICO El diagnstico se realiza mediante el control de la glucosa en sangre de una o varias de las siguientes maneras:  Anlisis A1c. Es una sustancia qumica que se encuentra en la [place].  Control de glucosa en sangre con tiras de prueba.  Resultados de laboratorio. TRATAMIENTO Primero, es importante conocer la causa de la hiperglucemia antes de tratarla. El tratamiento puede ser el siguiente, [place] pueden ser otros:  Educacin  Cambios o ajustes en los medicamentos.  Cambios o ajustes en el plan de alimentacin.  Tratamiento por enfermedades, infecciones, etc.  Control de glucosa en sangre ms frecuente.  Cambios en el plan de ejercicios.  Disminucin o interrupcin del consumo de esteroides.  Cambios en el estilo de vida. INSTRUCCIONES PARA EL CUIDADO DOMICILIARIO  Contrlese la glucosa en sangre, como se lo indicaron.  Haga ejercicios regularmente. El profesional que lo asiste le dar instrucciones  relacionadas con el ejercicio fsico. La prediabetes que es consecuencia de situaciones de estrs, puede mejorar con la actividad fsica.  Consuma alimentos saludables y balanceados. Coma a menudo y de [place] regular, en momentos fijos. El profesional o el nutricionista le dar una dieta especial para controlar su ingestin de azcar.  Mantener su peso ideal es importante. Si lo necesita, perder un poco de peso, como 5  7 Kg. puede ser beneficioso para [employer] niveles de Production assistant, radio. SOLICITE ATENCIN MDICA SI:  Tiene preguntas relacionadas con los medicamentos, la actividad o la dieta.  Contina teniendo sntomas (como mucha sed, deseos intensos de Geographical information systems officer o aumento de peso) SOLICITE ATENCIN MDICA DE INMEDIATO SI:  Vomita o tiene diarrea.  Su respiracin huele frutal.  La frecuencia respiratoria es ms rpida o ms lenta.  Est somnoliento o incoherente.  Siente adormecimiento, hormigueos o Tax adviser o en las manos.  Siente dolor en el pecho.  Sus sntomas empeoran aunque haya seguido las indicaciones de su mdico.  Tiene otras preguntas o preocupaciones.   Esta informacin no tiene Theme park manager el consejo del mdico. Asegrese de hacerle al mdico cualquier pregunta que tenga.   Document Released: 04/13/2005 Document Revised: 07/06/2011 Elsevier Interactive Patient Education 2016 [employer].    La diabetes mellitus y los alimentos (Diabetes Mellitus and Food) Es importante que controle su nivel de azcar en la sangre (glucosa). El nivel de glucosa en sangre depende en gran medida de lo que usted come. Comer alimentos saludables en las cantidades [place] a lo largo del Futures trader, aproximadamente a la misma hora [employer], lo ayudar a Chief Operating Officer su nivel de Event organiser. Tambin puede ayudarlo a retrasar o Fish farm manager de la diabetes mellitus. Comer de [employer] saludable incluso puede ayudarlo a Event organiser de presin arterial y a  Barista o Pharmacologist un peso saludable.  Entre las recomendaciones generales para alimentarse y Water quality scientist los alimentos de forma saludable, se incluyen las siguientes:  Respetar las comidas principales y comer colaciones con regularidad. Evitar pasar largos perodos sin comer con el fin de perder peso.  Seguir una dieta que consista principalmente en alimentos de origen vegetal, como frutas, vegetales, frutos secos, legumbres y cereales integrales.  Utilizar mtodos de coccin a baja temperatura, como hornear, en lugar de mtodos de coccin a alta temperatura, como frer en abundante aceite. Trabaje con el nutricionista para aprender a Acupuncturist nutricional de las etiquetas de los alimentos. CMO PUEDEN AFECTARME LOS ALIMENTOS? Carbohidratos Los carbohidratos afectan el nivel de glucosa en sangre ms que cualquier otro tipo de alimento. El nutricionista lo ayudar a Chief Strategy Officer cuntos carbohidratos puede consumir en cada comida y ensearle a contarlos. El recuento de carbohidratos es importante para mantener la glucosa en sangre en un nivel saludable, en especial si utiliza insulina o toma determinados medicamentos para la diabetes mellitus. Alcohol El alcohol puede provocar disminuciones sbitas de la glucosa en sangre (hipoglucemia), en especial si utiliza insulina o toma determinados medicamentos para la diabetes mellitus. La hipoglucemia es una afeccin que puede poner en peligro la vida. Los sntomas de la hipoglucemia (somnolencia, mareos y Administrator) son similares a los sntomas de haber consumido mucho alcohol.  Si el mdico lo autoriza a beber alcohol, hgalo con moderacin y siga estas pautas:  Las mujeres no deben beber ms de un trago por da, y los hombres no deben beber ms de dos tragos por Futures trader. Un trago es igual a:  12 onzas (355 ml) de cerveza  5 onzas de vino (150 ml) de vino  1,5onzas (45ml) de bebidas espirituosas  No beba con el estmago  vaco.  Mantngase hidratado. Beba agua, gaseosas dietticas o t helado sin azcar.  Las gaseosas comunes, los jugos y otros refrescos podran contener muchos carbohidratos y se Heritage manager. QU ALIMENTOS NO SE RECOMIENDAN? Cuando haga las elecciones de alimentos, es importante que recuerde que todos los alimentos son distintos. Algunos tienen menos nutrientes que otros por porcin, aunque podran tener la misma cantidad de caloras o carbohidratos. Es difcil darle al cuerpo lo que necesita cuando consume alimentos con menos nutrientes. Estos son algunos ejemplos de alimentos que debera evitar ya que contienen muchas caloras y carbohidratos, pero pocos nutrientes:  Neurosurgeon trans (la mayora de los alimentos procesados incluyen grasas trans en la etiqueta de Informacin nutricional).  Gaseosas comunes.  Jugos.  Caramelos.  Dulces, como tortas, pasteles, rosquillas y [place].  Comidas fritas. QU ALIMENTOS PUEDO COMER? Consuma alimentos ricos en nutrientes, que nutrirn el cuerpo y lo mantendrn saludable. Los alimentos que debe comer tambin dependern de varios factores, como:  Las caloras que necesita.  Los medicamentos que toma.  Su peso.  El nivel de glucosa en [place].  El [place] de presin arterial.  El nivel de colesterol. Debe consumir una amplia variedad de alimentos, por ejemplo:  Protenas.  Cortes de [employer].  Protenas con bajo contenido de grasas saturadas, como pescado, clara de huevo y frijoles. Evite las carnes procesadas.  Frutas y vegetales.  Frutas y Sports administrator que pueden ayudar a Chief Operating Officer los niveles sanguneos de [place], como [place], mangos y batatas.  Productos lcteos.  Elija productos lcteos sin grasa o con bajo contenido de [place], como [place], yogur y [place].  Cereales, panes, pastas y arroz.  Elija cereales integrales, como panes multicereales, avena en grano y arroz integral. Estos alimentos pueden ayudar a controlar la  presin arterial.  Rosalin Hawking.  Alimentos que contengan grasas saludables, como frutos secos, Chartered certified accountant, aceite de [place], aceite de canola y pescado. TODOS LOS QUE PADECEN DIABETES MELLITUS TIENEN EL MISMO PLAN DE COMIDAS? Dado que todas las personas que padecen diabetes mellitus son distintas, no hay un solo plan de comidas que funcione para todos. Es [employer] importante  que se rena con un nutricionista que lo ayudar a crear un plan de comidas adecuado para usted.   Esta informacin no tiene Theme park manager el consejo del mdico. Asegrese de hacerle al mdico cualquier pregunta que tenga.   Document Released: 07/21/2007 Document Revised: 05/04/2014 Elsevier Interactive Patient Education 2016 [employer].     Balanitis (Balanitis) La balanitis es la inflamacin de la cabeza del pene (glande).  CAUSAS  Puede tener mltiples causas, tanto infecciosas como no infecciosas. Con frecuencia, la balanitis es el resultado de una higiene personal deficiente, especialmente en los hombres no circuncidados. Sin una higiene [place], los virus, las bacterias y los hongos se acumulan entre el prepucio y el glande. Esto puede ocasionar una infeccin. La falta de aire y la irritacin debido a la secrecin normal llamada esmegma contribuyen al problema en los hombres no circuncidados. Otras causas son:  Irritacin qumica por el uso de algunos jabones y geles de ducha (especialmente jabones con perfume), condones, lubricantes personales, vaselina, espermicidas y acondicionadores de telas.  Enfermedades de la piel, como el eczema, la dermatitis y la psoriasis.  Alergias a algunos frmacos, como tetraciclina y sulfas.  Algunas enfermedades como la cirrosis heptica, insuficiencia cardaca congestiva y enfermedades renales.  Obesidad mrbida. FACTORES DE RIESGO  Diabetes mellitus.  Un prepucio apretado que es difcil de tirar hacia atrs hasta pasar el glande (fimosis).  Tener relaciones sexuales sin  usar un condn. Blake Divine SNTOMAS  Los sntomas pueden ser:  Secrecin que proviene de la zona debajo del prepucio.  Sensibilidad.  Picazn e imposibilidad para Landscape architect ereccin (debido al dolor).  Irritacin y erupcin cutnea.  Llagas en el glande y el prepucio. DIAGNSTICO El diagnstico de balanitis se realiza a travs de un examen fsico. TRATAMIENTO El tratamiento se basa en la causa. El tratamiento puede incluir:  Lavados frecuentes.  Mantener el glande y el prepucio secos.  El uso de medicamentos, como cremas, Clinical research associate, antibiticos o medicamentos para tratar infecciones por hongos.  Baos de asiento. Si la irritacin est originada en una cicatriz del prepucio que impide una retraccin fcil, se recomienda una circuncisin.  INSTRUCCIONES PARA EL CUIDADO EN EL HOGAR  Debe evitar las relaciones sexuales hasta que la afeccin se haya mejorado. ASEGRESE DE QUE:  Comprende estas instrucciones.  Controlar su afeccin.  Recibir ayuda de inmediato si no mejora o si empeora.   Esta informacin no tiene Theme park manager el consejo del mdico. Asegrese de hacerle al mdico cualquier pregunta que tenga.   Document Released: 12/14/2012 Document Revised: 04/18/2013 Elsevier Interactive Patient Education 2016 [employer].    Dolor de pecho inespecfico (Nonspecific Chest Pain) Suele ser difcil encontrar la causa del dolor de [place]. Siempre existe una posibilidad de que el dolor est relacionado con algo grave, como un infarto de miocardio o un cogulo sanguneo en los pulmones. Hay muchas enfermedades que no son potencialmente mortales que pueden causar dolor de [place]. Es importante que concurra a las visitas de control con el mdico.  CUIDADOS EN EL HOGAR  Si le recetaron antibiticos, asegrese de terminarlos, incluso si comienza a Actor.  Evite las [employer] causen dolor de [place].  No consuma ningn producto que contenga tabaco,  lo que incluye cigarrillos, tabaco de Theatre manager o Administrator, Civil Service. Si necesita ayuda para dejar de fumar, consulte al mdico.  No beba alcohol.  Tome los medicamentos solamente como se lo haya indicado el mdico.  Concurra a todas las visitas de control como se  lo haya indicado el mdico. Esto es importante. Esto incluye otros estudios si el dolor de pecho no desaparece.  El mdico puede indicarle que mantenga la cabeza levantada (elevada) mientras duerme.  Haga cambios en su estilo de vida segn las indicaciones del mdico. Estos pueden incluir lo siguiente:  Education administrator actividad fsica con regularidad. Pdale al mdico que le sugiera algunas actividades que sean seguras para usted.  Consumir una dieta cardiosaludable. El mdico o un especialista en alimentacin (nutricionista) pueden ayudarlo a que haga elecciones saludables.  Mantener un peso saludable.  Controlar la diabetes, si es necesario.  Reducir las situaciones de estrs. SOLICITE AYUDA SI:  El dolor de pecho no desaparece, incluso despus del tratamiento.  Tiene una erupcin cutnea con ampollas en el pecho.  Tiene fiebre. SOLICITE AYUDA DE INMEDIATO SI:  El dolor en el pecho es ms intenso.  La tos empeora, o expectora sangre.  Siente un dolor intenso en el vientre (abdomen).  Se siente muy dbil.  Pierde el conocimiento (se desmaya).  Tiene escalofros.  Tiene una molestia repentina e inexplicable en el pecho.  Tiene molestias repentinas e [employer], la espalda, el cuello o la [place].  Le falta el aire en cualquier momento.  Comienza a sudar de [place] repentina o la piel se le humedece.  Siente nuseas.  Vomita.  Se siente repentinamente mareado o se desmaya.  Siente que el corazn comienza a latir rpidamente o que se saltea latidos. Estos sntomas pueden Customer service manager. No espere hasta que los sntomas desaparezcan. Solicite atencin mdica de inmediato.  Comunquese con el servicio de emergencias de su localidad (911 en los [place]). No conduzca por sus propios medios [employer].   Esta informacin no tiene Theme park manager el consejo del mdico. Asegrese de hacerle al mdico cualquier pregunta que tenga.   Document Released: 07/10/2008 Document Revised: 05/04/2014 Elsevier Interactive Patient Education [employer]       Emergency Department Resource Guide 1) Find a Doctor and Pay Out of Pocket Although you won't have to find out who is covered by your insurance plan, it is a good idea to ask around and get recommendations. You will then need to call the office and see if the doctor you have chosen will accept you as a new patient and what types of options they offer for patients who are self-pay. Some doctors offer discounts or will set up payment plans for their patients who do not have insurance, but you will need to ask so you aren't surprised when you get to your appointment.  2) Contact Your Local Health Department Not all health departments have doctors that can see patients for sick visits, but many do, so it is worth a call to see if yours does. If you don't know where your local health department is, you can check in your phone book. The CDC also has a tool to help you locate your state's health department, and many state websites also have listings of all of their local health departments.  3) Find a Walk-in Clinic If your illness is not likely to be very severe or complicated, you may want to try a walk in clinic. These are popping up all over the country in pharmacies, drugstores, and shopping centers. They're usually staffed by nurse practitioners or physician assistants that have been trained to treat common illnesses and complaints. They're usually fairly quick and inexpensive. However, if you have serious medical issues or chronic medical problems,  these are probably not your best option.  No Primary  Care Doctor: - Call Health Connect at  [phone] - they can help you locate a primary care doctor that  accepts your insurance, provides certain services, etc. - Physician Referral Service- [phone]  Chronic Pain Problems: Organization         Address  Phone   Notes  [facility]  [phone] Patients need to be referred by their primary care doctor.   Medication Assistance: Organization         Address  Phone   Notes  [facility] Medication [facility] [address] [place] [place], [place] [address] [phone] --Must be a resident of [facility] -- Must have NO insurance coverage whatsoever (no Medicaid/ Medicare, etc.) -- The pt. MUST have a primary care doctor that directs their care regularly and follows them in the community   MedAssist  [phone]   [employer]  [phone]    Agencies that provide inexpensive medical care: Organization         Address  Phone   Notes  [facility]  [phone]   [facility]    [phone]   [facility] [address] [place], [place] [address] [phone]   [facility] [address] [place], [place] [phone]   [facility]anned Parenthood    [phone]   [facility]    [phone]   [facility]  [address], [place] Phone:  [phone], Fax:  [phone] Hours of Operation:  9 am - 6 pm, M-F.  Also accepts Medicaid/Medicare and self-pay.  [facility]  [address], [place] Phone: [phone], Fax: [phone]. Hours of Operation:  8:30 am - 5:30 pm, M-F.  Also accepts Medicaid and self-pay.  [facility] [place] [address] [place] Phone: [phone]   [facility] [address] Natasha Bence [place], [place] [phone], Ext. [phone] Mondays & Thursdays: 7-9 AM.  First 15 patients are seen on a first  come, first serve basis.    Medicaid-accepting [facility] Providers:  Organization         Address  Phone   Notes  [facility] [address], [place] [phone] Also accepts self-pay patients.  [facility] [address] Laurell Josephs [place], [place]  [phone]   [facility] [address], [place] [phone]   [facility] [address], [place] [phone]   Renaye Rakers [address], [place]   [phone] Only accepts [place] Access [place] patients after they have their name applied to their card.   Self-Pay (no insurance) in [facility]:  Organization         Address  Phone   Notes  Sickle Cell Patients, [facility] Internal Medicine [address] [place], [place] [phone]   [facility] [address] [place], [place] [phone]   [facility] [place]  [address], [place] [phone]   [facility]/Dr. Osei-Bonsu  [address] [place] or [address], [place] [phone] Phone number for both [place] and [place] locations is the same.  Urgent Medical and  [facility] [address] [place] [phone]   [facility] [address], [place] or [address] [phone]   [facility] [address], [place] [phone], phone; [phone], fax Sees patients 1st and 3rd Saturday of every month.  Must not qualify for public or private insurance (i.e. Medicaid, Medicare, [place] Health Choice, Veterans' Benefits)  Household income should be no more than 200% of the poverty level The clinic cannot treat you if you are pregnant or think you are pregnant  Sexually transmitted diseases are not treated at the clinic.    Dental Care: Organization          Address  Phone  Notes  [facility] Department of [facility] [address] [place], [place] [phone] Accepts children up to age 74 who are enrolled in [place] or [place] Health Choice; pregnant women with a Medicaid card; and children who have applied for Medicaid or [place] Health Choice, but were declined, whose parents can pay a reduced fee at time of service.  [facility]  [address], [place] [phone] Accepts children up to age 76 who are enrolled in [place] or [place] Health Choice; pregnant women with a Medicaid card; and children who have applied for Medicaid or [place] Health Choice, but were declined, whose parents can pay a reduced fee at time of service.  [facility]  [address] [place], [place] [phone] Patients are seen by appointment only. Walk-ins are not accepted. [facility] will see patients 47 years of age and older. Monday - Tuesday (8am-5pm) Most Wednesdays (8:30-5pm) $30 per visit, cash only  [facility] Adult [facility] Access [facility]  [address], [facility] [phone] Patients are seen by appointment only. Walk-ins are not accepted. [facility] will see patients 58 years of age and older. One Wednesday Evening (Monthly: Volunteer Based).  $30 per visit, cash only  [employer]  [phone] for adults; Children under age 56, call Graduate Pediatric Dentistry at [phone]. Children aged 15-14, please call [phone] to request a pediatric application.  Dental services are provided in all areas of dental care including fillings, crowns and bridges, complete and partial dentures, implants, gum treatment, root canals, and extractions. Preventive care is also provided. Treatment is provided to both adults and children. Patients are selected via a lottery and there is often a waiting list.   [facility] [address], [place]  [phone] [email or web address]   [facility] [address] [place], [place] [phone], Ext. [phone] Second and Fourth Thursday of each month, opens at 6:30 AM; Clinic ends at 9 AM.  Patients are seen on a first-come first-served basis, and a limited number are seen during each clinic.   [facility]  [address] Ether Griffins [place], [place] [phone]   Eligibility Requirements You must have lived in [place], [place], or [place] counties for at least the last three months.   You cannot be eligible for state or federal sponsored [place], including [employer], [place], or [employer].   You generally cannot be eligible for healthcare insurance through your employer.    How to apply: Eligibility screenings are held every Tuesday and Wednesday afternoon from 1:00 pm until 4:00 pm. You do not need an appointment for the interview!  [facility]  Dental Clinic [address], [place], [place] [phone]   [facility] Health Department  [phone]   [facility] Health Department  [phone]   [facility] Health Department  [phone]    Behavioral Health Resources in the Community: Intensive Outpatient Programs Organization         Address  Phone  Notes  [facility] Services [address], [place], [place] [phone]   [facility] Outpatient [address], [place], [place] [phone]   [facility] [address], [place], [place]  [phone]   [facility] Mental Health [address],  [place], [place] [phone] or [phone]   Substance Abuse Resources Organization         Address  Phone  Notes  [facility]  [phone]   [facility]  [phone]   [facility] [place]  [phone]   Floydene Flock  [phone]   Residential & Outpatient Substance Abuse Program  [phone]   Psychological  Services Organization         Address  Phone  Notes  [facility] Behavioral Health  [phone]   [facility] Services  [phone]   [facility] Mental Health [address] [place] [phone] or [phone]    Mobile Crisis Teams Organization         Address  Phone  Notes  [facility], Mobile Crisis Care Unit  [phone]   [facility]  [address] [place], [place] [phone]   Doristine Locks [address] [place] [phone]    Self-Help/Support Groups Organization         Address  Phone             Notes  [facility] [place] - variety of support groups  [phone]- [phone] Call for more information  Narcotics Anonymous (NA), [facility] [address], [employer] [place]  2 meetings at this location   Statistician         Address  Phone  Notes  [facility] [address],    [place]  [phone]   [facility]  [address] [place] [address], [place], [place] [phone]   [facility] [address] [place], [place] [phone] Admissions: 8am-3pm M-F  [facility] [address]    [place], [place] [phone]   [facility] [address] [place], [place], [place] [phone]   [facility] [address],  [place], [place] [phone]   [facility] - Intensive Outpatient [address] Laurell Josephs [address], [place], [place] [phone]   [facility] (Addiction [facility] [address] [place]  [place], [place] [phone] or [phone]   [facility] ([facility]) [address] [place], [place] [phone] Accepts Medicaid  [facility] [place] [address],  [place] [phone] Substance Abuse/Addiction Treatment   [facility] Organization         Address  Phone  Notes  [facility]  [phone]   Angie Fava, PhD [address] Ervin Knack [place], [place]   [phone] or [phone]   [facility] Behavioral   [address] [place], [place] [phone]   [facility] [address], [place], [place] [phone] Insurance/Medicaid/sponsorship through [employer] and Families [address]  [place], [place] [phone] [place], [place] [phone]    Dr. Adele Schilder  [phone]   [facility] Dept. 1) [address] [place] 2) [place] 3)  [address], [place] [phone]  [phone]   [place] [phone] or [phone] (After Hours)

## 2015-05-21 NOTE — ED Provider Notes (Signed)
CSN: [id number]     Arrival date & time 05/21/15  1349 History   First MD Initiated Contact with Patient 05/21/15 1508     Chief Complaint  Patient presents with  . Chest Pain  . Penis Pain     (Consider location/radiation/quality/duration/timing/severity/associated sxs/prior Treatment) Patient is a 40 y.o. male presenting with chest pain and penile pain. The history is provided by the patient.  Chest Pain Associated symptoms: no abdominal pain, no back pain, no fever, no headache, no shortness of breath and not vomiting   Penis Pain Associated symptoms include chest pain. Pertinent negatives include no abdominal pain, no headaches and no shortness of breath.  Patient c/o left chest pain in the past couple days.  States episodes occur at rest, or when he lies down.  States is like a pinch, very quick onset/relief.  No exertional chest pain or discomfort. No sob or unusual doe. No associated nv or diaphoresis. No prolonged or pleuritic pain. No leg pain or swelling. No fever or chills. Rare non prod cough, no other uri c/o. ?mild dysuria. No penile discharge. +pain/irriration around head of penis/foreskin.   No abd or flank pain.      Past Medical History  Diagnosis Date  . Type 2 diabetes mellitus (HCC)   . Atrial septal defect   . Atrial septal defect, recurrent 06/16/2013  . Tricuspid regurgitation   . Diabetes type 2, uncontrolled (HCC) 03/28/2013  . Atypical chest pain 03/27/2013  . Status post thoracotomy 12/14/1997    Left anterior thoracotomy for lung biopsy within 1 month of ASD closure @ [facility]   . Chronic right-sided congestive heart failure (HCC)   . Right ventricular dysfunction   . S/P atrial septal defect closure 11/22/1997    Pericardial patch closure of reported secundum type ASD via median sternotomy by Dr Buford Dresser @ [facility]   . Partial anomalous pulmonary venous return   . Shortness of breath   . S/P redo atrial septal defect closure 07/20/2013    Redo patch closure of  persistent/recurrent atrial septal defect   . 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 Asd repair  11/22/1997    Dr Buford Dresser @ [facility]  . Back surgery  2011  . Appendectomy  2012  . Tee without cardioversion N/A 05/26/2013    Procedure: TRANSESOPHAGEAL ECHOCARDIOGRAM (TEE);  Surgeon: Antoine Poche, MD;  Location: [facility] END[facility]ITE;  Service: Cardiology;  Laterality: N/A;  . Thoracotomy Left 12/14/1997    Lung biopsy @ [facility]  . Asd repair N/A 07/20/2013    Procedure: REDO CLOSURE OF ATRIAL SEPTAL DEFECT (ASD);  Surgeon: Purcell Nails, MD;  Location: [facility] OR;  Service: Open Heart Surgery;  Laterality: N/A;  . Tricuspid valve replacement N/A 07/20/2013    Procedure: TRICUSPID VALVE REPAIR;  Surgeon: Purcell Nails, MD;  Location: [facility] OR;  Service: Open Heart Surgery;  Laterality: N/A;  . Intraoperative transesophageal echocardiogram N/A 07/20/2013    Procedure: INTRAOPERATIVE TRANSESOPHAGEAL ECHOCARDIOGRAM;  Surgeon: Purcell Nails, MD;  Location: [facility] OR;  Service: Open Heart Surgery;  Laterality: N/A;  . Asd repair N/A 06/27/2013    Procedure: ATRIAL SEPTAL DEFECT (ASD) closure;  Surgeon: Micheline Chapman, MD;  Location: [facility] CATH LAB;  Service: Cardiovascular;  Laterality: N/A;   Family History  Problem Relation Age of Onset  . Diabetes Mellitus II     Social History  Substance Use Topics  . Smoking status: Never Smoker   . Smokeless tobacco: Never Used  . Alcohol Use: No     Comment: History of alcohol abuse    Review of Systems  Constitutional: Negative for fever and chills.  HENT: Negative for sore throat.   Eyes: Negative for redness.  Respiratory: Negative for shortness of breath.   Cardiovascular: Positive for chest pain. Negative for leg swelling.  Gastrointestinal: Negative for vomiting, abdominal pain and diarrhea.  Genitourinary: Negative for flank pain and testicular pain.   Musculoskeletal: Negative for back pain and neck pain.  Skin: Negative for rash.  Neurological: Negative for headaches.  Hematological: Does not bruise/bleed easily.  Psychiatric/Behavioral: Negative for confusion.      Allergies  Review of patient's allergies indicates no known allergies.  Home Medications   Prior to Admission medications   Medication Sig Start Date End Date Taking? Authorizing Provider  aspirin EC 81 MG EC tablet Take 1 tablet (81 mg total) by mouth daily. 03/29/13  Yes Oval Linsey, MD  lisinopril (PRINIVIL,ZESTRIL) 10 MG tablet Take 10 mg by mouth daily.   Yes Historical Provider, MD  metFORMIN (GLUCOPHAGE) 500 MG tablet Take 500 mg by mouth 2 (two) times daily with a meal.   Yes Historical Provider, MD  metFORMIN (GLUCOPHAGE) 1000 MG tablet Take 1 tablet (1,000 mg total) by mouth 2 (two) times daily with a meal. Patient not taking: Reported on 05/21/2015 06/21/13   [name], MD   BP 125/67 mmHg  Pulse 76  Temp(Src) 97.8 F (36.6 C) (Oral)  Resp 16  Ht  (1.651 m)  Wt 77.111 kg  BMI 28.29 kg/m2  SpO2 98% Physical Exam  Constitutional: He is oriented to person, place, and time. He appears well-developed and well-nourished. No distress.  HENT:  Mouth/Throat: Oropharynx is clear and moist.  Eyes: Conjunctivae are normal. No scleral icterus.  Neck: Neck supple. No tracheal deviation present.  Cardiovascular: Normal rate, regular rhythm, normal heart sounds and intact distal pulses.  Exam reveals no gallop and no friction rub.   No murmur heard. Pulmonary/Chest: Effort normal and breath sounds normal. No accessory muscle usage. No respiratory distress. He exhibits tenderness.  + mild chest wall tenderness, reproducing symptoms. No sts, no mass, no skin changes or erythema.    Abdominal: Soft. Bowel sounds are normal. He exhibits no distension. There is no tenderness.  Genitourinary:  Normal ext genitalia, uncircumcised penis able to fully  retract and reduce foreskin, mild balanitis. no penile d/c. No scrotal or testicular pain, swelling, or tenderness.  No cva tenderness.   Musculoskeletal: Normal range of motion. He exhibits no edema or tenderness.  Neurological: He is alert and oriented to person, place, and time.  Skin: Skin is warm and dry. No rash noted. He is not diaphoretic.  Psychiatric: He has a normal mood and affect.  Nursing note and vitals reviewed.   ED Course  Procedures (including critical care time) Labs Review   Results for orders placed or performed during the hospital encounter of 05/21/15  Basic metabolic panel  Result Value Ref Range   Sodium 132 (L) 135 - 145 mmol/L   Potassium 4.4 3.5 - 5.1 mmol/L   Chloride 97 (L) 101 - 111 mmol/L   CO2 22 22 - 32 mmol/L   Glucose, Bld 519 (H) 65 - 99 mg/dL   BUN 14 6 - 20 mg/dL   Creatinine, Ser 9.60 0.61 - 1.24 mg/dL   Calcium 8.6 (L) 8.9 -  10.3 mg/dL   GFR calc non Af Amer >60 >60 mL/min   GFR calc Af Amer >60 >60 mL/min   Anion gap 13 5 - 15  CBC  Result Value Ref Range   WBC 7.1 4.0 - 10.5 K/uL   RBC 4.81 4.22 - 5.81 MIL/uL   Hemoglobin 16.1 13.0 - 17.0 g/dL   HCT 04.5 40.9 - 81.1 %   MCV 92.9 78.0 - 100.0 fL   MCH 33.5 26.0 - 34.0 pg   MCHC 36.0 30.0 - 36.0 g/dL   RDW 91.4 78.2 - 95.6 %   Platelets 169 150 - 400 K/uL  Troponin I  Result Value Ref Range   Troponin I <0.03 <0.031 ng/mL  Urinalysis, Routine w reflex microscopic (not at [facility])  Result Value Ref Range   Color, Urine YELLOW YELLOW   APPearance CLEAR CLEAR   Specific Gravity, Urine 1.010 1.005 - 1.030   pH 6.0 5.0 - 8.0   Glucose, UA >1000 (A) NEGATIVE mg/dL   Hgb urine dipstick NEGATIVE NEGATIVE   Bilirubin Urine NEGATIVE NEGATIVE   Ketones, ur NEGATIVE NEGATIVE mg/dL   Protein, ur NEGATIVE NEGATIVE mg/dL   Nitrite NEGATIVE NEGATIVE   Leukocytes, UA NEGATIVE NEGATIVE  Urine microscopic-add on  Result Value Ref Range   Squamous Epithelial / LPF 0-5 (A) NONE SEEN   WBC, UA  0-5 0 - 5 WBC/hpf   RBC / HPF NONE SEEN 0 - 5 RBC/hpf   Bacteria, UA RARE (A) NONE SEEN  POC CBG, ED  Result Value Ref Range   Glucose-Capillary 378 (H) 65 - 99 mg/dL   Dg Chest 2 View  06/10/863  CLINICAL DATA:  Left-sided chest pain for 2 days EXAM: CHEST  2 VIEW COMPARISON:  08/14/2013 FINDINGS: Heart size and vascular pattern normal. Stable replacement cardiac valve. Mildly prominent markings left lower lobe unchanged from prior study. Lungs otherwise clear. No pleural effusions. IMPRESSION: Mild left lung base scarring stable.  No acute abnormalities. Electronically Signed   By: Esperanza Heir M.D.   On: 05/21/2015 14:24        I have personally reviewed and evaluated these images and lab results as part of my medical decision-making.   EKG Interpretation   Date/Time:  Tuesday May 21 2015 14:01:17 EST Ventricular Rate:  75 PR Interval:  154 QRS Duration: 140 QT Interval:  402 QTC Calculation: 448 R Axis:   -23 Text Interpretation:  Normal sinus rhythm Right bundle branch block Left  ventricular hypertrophy No significant change since last tracing Confirmed  by Denton Lank  MD, Caryn Bee ([id number]) on 05/21/2015 3:08:00 PM      MDM   Labs. Cxr.  Reviewed nursing notes and prior charts for additional history.   Glucose elevated, 519 - feel likely contributing to balanitis/fungal.  Area cleaned, clotrimazole cream.   For glucose, iv ns bolus. novolog sq.  Glucose 370.    Additional ivf.  hco3 is normal.  No nv. No fevers.   After symptoms x days, trop neg. Symptoms/eval does not appear c/w acs.  Will add glipizide to current metformin.    rec close pcp f/u.    Return precautions.      Cathren Laine, MD 05/21/15 1911

## 2015-05-21 NOTE — ED Notes (Signed)
AC called for Lotrmin.
